# Patient Record
Sex: Male | Born: 1950
Health system: Southern US, Community
[De-identification: ages and names within clinical notes are randomized; demographics above are authoritative.]

## PROBLEM LIST (undated history)

## (undated) DIAGNOSIS — E7800 Pure hypercholesterolemia, unspecified: Secondary | ICD-10-CM

## (undated) DIAGNOSIS — Z86010 Personal history of colonic polyps: Secondary | ICD-10-CM

## (undated) DIAGNOSIS — T7840XA Allergy, unspecified, initial encounter: Secondary | ICD-10-CM

## (undated) DIAGNOSIS — M26629 Arthralgia of temporomandibular joint, unspecified side: Secondary | ICD-10-CM

## (undated) DIAGNOSIS — N209 Urinary calculus, unspecified: Secondary | ICD-10-CM

## (undated) HISTORY — DX: Personal history of colonic polyps: Z86.010

## (undated) HISTORY — DX: Pure hypercholesterolemia, unspecified: E78.00

## (undated) HISTORY — DX: Arthralgia of temporomandibular joint, unspecified side: M26.629

## (undated) HISTORY — PX: APPENDECTOMY: SHX54

## (undated) HISTORY — DX: Urinary calculus, unspecified: N20.9

## (undated) HISTORY — PX: VASECTOMY: SHX75

## (undated) HISTORY — DX: Allergy, unspecified, initial encounter: T78.40XA

---

## 2005-09-15 HISTORY — PX: COLONOSCOPY: SHX174

## 2010-11-28 ENCOUNTER — Ambulatory Visit: Payer: Self-pay | Admitting: Internal Medicine

## 2010-12-02 ENCOUNTER — Encounter: Payer: Self-pay | Admitting: Internal Medicine

## 2010-12-02 ENCOUNTER — Ambulatory Visit (INDEPENDENT_AMBULATORY_CARE_PROVIDER_SITE_OTHER): Payer: Managed Care, Other (non HMO) | Admitting: Internal Medicine

## 2010-12-02 ENCOUNTER — Encounter (INDEPENDENT_AMBULATORY_CARE_PROVIDER_SITE_OTHER): Payer: Self-pay | Admitting: *Deleted

## 2010-12-02 DIAGNOSIS — L570 Actinic keratosis: Secondary | ICD-10-CM | POA: Insufficient documentation

## 2010-12-02 DIAGNOSIS — E785 Hyperlipidemia, unspecified: Secondary | ICD-10-CM | POA: Insufficient documentation

## 2010-12-02 DIAGNOSIS — F329 Major depressive disorder, single episode, unspecified: Secondary | ICD-10-CM | POA: Insufficient documentation

## 2010-12-02 DIAGNOSIS — Z0000 Encounter for general adult medical examination without abnormal findings: Secondary | ICD-10-CM

## 2010-12-02 DIAGNOSIS — F3289 Other specified depressive episodes: Secondary | ICD-10-CM | POA: Insufficient documentation

## 2010-12-02 LAB — CONVERTED CEMR LAB
BUN: 21 mg/dL (ref 6–23)
Bilirubin, Direct: 0.1 mg/dL (ref 0.0–0.3)
CRP, High Sensitivity: 0.6
Chloride: 105 meq/L (ref 96–112)
Glucose, Bld: 96 mg/dL (ref 70–99)
Indirect Bilirubin: 0.5 mg/dL (ref 0.0–0.9)
LDL Cholesterol: 101 mg/dL — ABNORMAL HIGH (ref 0–99)
PSA: 0.52 ng/mL (ref <=4.00)
Potassium: 4.4 meq/L (ref 3.5–5.3)
Total Bilirubin: 0.6 mg/dL (ref 0.3–1.2)
VLDL: 21 mg/dL (ref 0–40)

## 2010-12-03 ENCOUNTER — Encounter: Payer: Self-pay | Admitting: Internal Medicine

## 2010-12-12 NOTE — Letter (Signed)
   [place] at [facility] [address] [place], [place]  [address]  [place] Phone: [phone]      December 03, 2010   [place] [address] [place], [place] [address]  RE:  LAB RESULTS  Dear  Mr. Kirshenbaum,  The following is an interpretation of your most recent lab tests.  Please take note of any instructions provided or changes to medications that have resulted from your lab work.  PSA:  normal - no follow-up needed PSA: 0.52  ELECTROLYTES:  Good - no changes needed  KIDNEY FUNCTION TESTS:  Good - no changes needed  LIVER FUNCTION TESTS:  Good - no changes needed  LIPID PANEL:  Good - no changes needed Triglyceride: 104   Cholesterol: 162   LDL: 101   HDL: 40   Chol/HDL%:  4.1 Ratio  THYROID STUDIES:  Thyroid studies normal TSH: 3.373     C Reactive Protein - normal.       Sincerely Yours,    Dr. Thomos Lemons  Appended Document:  mailed

## 2010-12-12 NOTE — Letter (Signed)
Summary: Primary Care Consult Scheduled Letter  [place] at [facility]  [address]   [place], [place] [address]   Phone: [phone]  Fax: [phone]      12/02/2010 MRN: [id number]  [place] STANDRE [address] [place], [place]  [address]  [place]    Dear Mr. Gagner,      We have scheduled an appointment for you.  At the recommendation of Dr.YOO, we have scheduled you a consult with [facility] , GARY ENGSTROM,PA on APRIL 24 ,2012 at 9AM.  Their address is_[address] ,[place]  [address]. The office phone number is [phone].  If this appointment day and time is not convenient for you, please feel free to call the office of the doctor you are being referred to at the number listed above and reschedule the appointment.     It is important for you to keep your scheduled appointments. We are here to make sure you are given good patient care.    Thank you,  Darral Dash Patient Care Coordinator [place] at [facility]

## 2010-12-17 NOTE — Assessment & Plan Note (Signed)
Summary: new moved to this slot as Dr Artist Pais had meeting/mhf   Vital Signs:  Patient profile:   60 year old male Height:      66.25 inches Weight:      171.25 pounds BMI:     27.53 O2 Sat:      100 % on Room air Temp:     97.5 degrees F oral Pulse rate:   46 / minute Resp:     16 per minute BP sitting:   148 / 80  (left arm) Cuff size:   regular  Vitals Entered By: Glendell Docker CMA (December 02, 2010 8:42 AM)  O2 Flow:  Room air CC: New Patient  Comments discuss change in hearing over the past 6 months, evaluation of spot on forehead   Primary Care Provider:  Dondra Spry DO  CC:  New Patient .  History of Present Illness: 60 y/o male to establish  he has hx of hyperlipidemia.  not taking meds reduced fatty food intake increase in exericse - mainly walking.  10 miles per day  hx of adj d/o - resolved  Preventive Screening-Counseling & Management  Alcohol-Tobacco     Alcohol drinks/day: 0     Smoking Status: never  Caffeine-Diet-Exercise     Caffeine use/day: 2 beverages daily     Does Patient Exercise: yes     Times/week: 5  Allergies (verified): No Known Drug Allergies  Past History:  Past Medical History: Adjustment D/O depression - related to stress at work   stopped taking celexa x 1 yr Hyperlipidemia - never took simvastatin colon polyp -   Past Surgical History: Appendectomy 1971     Family History: Family History of Arthritis-mother Family History of Colon CA 1st degree relative father Family History High cholesterol father Alzhiemers-mother mother died age 60 - complications of alzheimer father 52 - fairly healthy no prostate cancer    Social History: Occupation: Magazine features editor Stage manager) Married 36 years Corrie Dandy) 1 daughter 3 sons    Never Smoked Alcohol use-no moved from [employer] Status:  never Caffeine use/day:  2 beverages daily Does Patient Exercise:  yes  Physical Exam  General:  alert, well-developed, and  well-nourished.   Head:  normocephalic and atraumatic.   Ears:  unable to hear finger rub in right ear Mouth:  good dentition and pharynx pink and moist.   Neck:  No deformities, masses, or tenderness noted.no carotid bruits.   Lungs:  normal respiratory effort, no accessory muscle use, no crackles, and no wheezes.   Heart:  regular rhythm, no murmur, no gallop, no rub, and bradycardia.   Abdomen:  soft, non-tender, normal bowel sounds, and no masses.   Pulses:  dorsalis pedis and posterior tibial pulses are full and equal bilaterally Extremities:  No lower extremity edema Neurologic:  cranial nerves II-XII intact and gait normal.     Impression & Recommendations:  Problem # 1:  PREVENTIVE HEALTH CARE (ICD-V70.0)  pt declines DRE  Orders: EKG w/ Interpretation (93000)  Colonoscopy: Done (12/05/2008) Flu Vax: Historical (07/29/2010)     Problem # 2:  HYPERLIPIDEMIA (ICD-272.4)  Orders: T-Basic Metabolic Panel [id number]) T-Lipid Profile [id number]) T-Hepatic Function [id number]) CRP, high sensitivity-[facility] ([id number]) T-TSH [id number])  Problem # 3:  ACTINIC KERATOSIS, HEAD (ICD-702.0)  Orders: Dermatology Referral (Derma)  Other Orders: T-PSA ([id number])  Patient Instructions: 1)  Our office will contact you re:  dermatology referral 2)  Please forward copy of your colonoscopy results 3)  Please schedule a follow-up appointment in  1 year. 4)  We will contact you re:  blood test results 5)  Please schedule a follow-up appointment in 1 year. 6)  Please schedule a follow-up appointment as needed.   Orders Added: 1)  T-Basic Metabolic Panel [[id number]] 2)  T-Lipid Profile [[id number]] 3)  T-Hepatic Function [[id number]] 4)  CRP, high sensitivity-[facility] [[id number]] 5)  T-TSH [[id number]] 6)  T-PSA [[id number]] 7)  EKG w/ Interpretation [93000] 8)  Dermatology Referral [Derma] 9)  New Patient 40-64 years [99386]   Immunization  History:  Influenza Immunization History:    Influenza:  historical (07/29/2010)   Immunization History:  Influenza Immunization History:    Influenza:  Historical (07/29/2010)  Current Allergies (reviewed today): No known allergies     Preventive Care Screening  Colonoscopy:    Date:  12/05/2008    Results:  Done

## 2013-05-24 ENCOUNTER — Encounter: Payer: Self-pay | Admitting: Internal Medicine

## 2013-05-24 ENCOUNTER — Ambulatory Visit (INDEPENDENT_AMBULATORY_CARE_PROVIDER_SITE_OTHER): Payer: Managed Care, Other (non HMO) | Admitting: Internal Medicine

## 2013-05-24 VITALS — BP 145/92 | HR 74 | Temp 97.6°F | Ht 66.0 in | Wt 177.8 lb

## 2013-05-24 DIAGNOSIS — Z0000 Encounter for general adult medical examination without abnormal findings: Secondary | ICD-10-CM | POA: Insufficient documentation

## 2013-05-24 NOTE — Progress Notes (Signed)
  Subjective:    Patient ID: Justin Murphy, male    DOB: Jul 16, 1951, 62 y.o.   MRN: [id number]  HPI CPX  Past Medical History  Diagnosis Date  . Allergy    Past Surgical History  Procedure Laterality Date  . Appendectomy    . Vasectomy     History   Social History  . Marital Status: Married    Spouse Name: N/A    Number of Children: 4  . Years of Education: N/A   Occupational History  . programer     Social History Main Topics  . Smoking status: Never Smoker   . Smokeless tobacco: Never Used  . Alcohol Use: Yes     Comment: very rare   . Drug Use: No  . Sexual Activity: Not on file   Other Topics Concern  . Not on file   Social History Narrative   Original from [place] moved to [place] after retirement ~ 2011   Married 38 years Corrie Dandy)   1 daughter, 3 sons          Family History  Problem Relation Age of Onset  . Alzheimer's disease Mother   . Colon cancer Father     dx early 31s  . Prostate cancer Neg Hx   . Diabetes Other     uncle  . Heart disease Neg Hx      Review of Systems  Denies chest pain or shortness or breath No nausea, vomiting, diarrhea. A year ago, was visiting [place], so significant amount of red blood per rectum, was admitted to the hospital, no colonoscopy was done, was recommended to follow up with GI but he didn't. No further symptoms since. Reports his stools are occasionally loose but not diarrhea. No dysuria, difficulty urinating or gross hematuria.      Objective:   Physical Exam  BP 145/92  Pulse 74  Temp(Src) 97.6 F (36.4 C) (Oral)  Ht 5\' 6"  (1.676 m)  Wt 177 lb 12.8 oz (80.65 kg)  BMI 28.71 kg/m2  SpO2 99% General -- alert, well-developed, NAD.  Neck --no thyromegaly , normal carotid pulse Lungs -- normal respiratory effort, no intercostal retractions, no accessory muscle use, and normal breath sounds.  Heart-- normal rate, regular rhythm, no murmur.  Abdomen-- Not distended, good bowel sounds,soft,  non-tender. No mass,organomegaly.No rebound or rigidity. Rectal-- No external abnormalities noted. Normal sphincter tone. No rectal masses or tenderness. Brown stool, Hemoccult negative  Prostate: Prostate gland firm and smooth, no enlargement, nodularity, tenderness, mass, asymmetry or induration. Extremities-- no pretibial edema bilaterally  Neurologic-- alert & oriented X3. Speech, gait normal. Strength normal in all extremities.  Psych-- Cognition and judgment appear intact. Alert and cooperative with normal attention span and concentration. not anxious appearing and not depressed appearing.      Assessment & Plan:

## 2013-05-24 NOTE — Patient Instructions (Signed)
Please come back fasting: FLP, CMP, TSH, CBC, PSA--- dx V70 --- Think about a Tdap and zostavax ---- Check the  blood pressure 2 or 3 times a week, be sure it is between 110/60 and 140/85. If it is consistently higher or lower, let me know --- Next visit in one year and as needed

## 2013-05-25 ENCOUNTER — Encounter: Payer: Self-pay | Admitting: Internal Medicine

## 2013-05-25 ENCOUNTER — Ambulatory Visit: Payer: Managed Care, Other (non HMO) | Admitting: Internal Medicine

## 2013-05-25 NOTE — Assessment & Plan Note (Signed)
Last CPX dr Artist Pais 2012, see centricity, had a EKG  Tdap? Declined Zostavax? Benefits discussed, declined + FH Colon Ca, h/o GI bleed a year ago, see HPI. Had a cscope at age 62, no reports: strongly encouraged to get a Cscope, will refer to GI DRE normal, check a PSA Labs  Diet- Reports he is trying to eat healthy Exercise, doing great, walks up to 7 miles almost every day. Plays golf   Other issues: BP slt elevated, usually normal amb BPs, rec self monitoring Needs a referral to an audiologist, will arrange

## 2013-05-27 ENCOUNTER — Other Ambulatory Visit (INDEPENDENT_AMBULATORY_CARE_PROVIDER_SITE_OTHER): Payer: Managed Care, Other (non HMO)

## 2013-05-27 DIAGNOSIS — Z0000 Encounter for general adult medical examination without abnormal findings: Secondary | ICD-10-CM

## 2013-05-27 LAB — COMPREHENSIVE METABOLIC PANEL
CO2: 26 mEq/L (ref 19–32)
Calcium: 9 mg/dL (ref 8.4–10.5)
Creatinine, Ser: 0.9 mg/dL (ref 0.4–1.5)
GFR: 86.78 mL/min (ref >60.00)
Glucose, Bld: 94 mg/dL (ref 70–99)
Total Bilirubin: 0.7 mg/dL (ref 0.3–1.2)
Total Protein: 6.2 g/dL (ref 6.0–8.3)

## 2013-05-27 LAB — CBC WITH DIFFERENTIAL/PLATELET
Basophils Absolute: 0 10*3/uL (ref 0.0–0.1)
Hemoglobin: 15 g/dL (ref 13.0–17.0)
Lymphocytes Relative: 32.3 % (ref 12.0–46.0)
Monocytes Relative: 8.9 % (ref 3.0–12.0)
Neutro Abs: 2.6 10*3/uL (ref 1.4–7.7)
Neutrophils Relative %: 55.6 % (ref 43.0–77.0)
RBC: 4.52 Mil/uL (ref 4.22–5.81)
RDW: 13 % (ref 11.5–14.6)

## 2013-05-27 LAB — LIPID PANEL
HDL: 35.7 mg/dL — ABNORMAL LOW (ref >39.00)
Triglycerides: 111 mg/dL (ref 0.0–149.0)

## 2013-05-27 LAB — TSH: TSH: 2.09 u[IU]/mL (ref 0.35–5.50)

## 2013-05-27 LAB — PSA: PSA: 0.63 ng/mL (ref 0.10–4.00)

## 2013-05-30 ENCOUNTER — Telehealth: Payer: Self-pay | Admitting: *Deleted

## 2013-05-30 ENCOUNTER — Encounter: Payer: Self-pay | Admitting: *Deleted

## 2013-05-30 NOTE — Telephone Encounter (Signed)
Message copied by Eustace Quail on Mon May 30, 2013  8:29 AM ------      Message from: Willow Ora E      Created: Sun May 29, 2013  1:13 PM       Send a letter      Lollie Sails,      Your cholesterol is very good at 184.      Your liver, kidney, potassium, thyroid and prostate tests are all normal.      A CBC showed no anemia, your platelets are a little low  at around 122. We do need to recheck a CBC every year.      These are good results. ------

## 2013-05-30 NOTE — Telephone Encounter (Signed)
Letter sent. DJR  

## 2013-06-24 ENCOUNTER — Encounter: Payer: Self-pay | Admitting: Internal Medicine

## 2013-10-05 ENCOUNTER — Encounter: Payer: Self-pay | Admitting: Internal Medicine

## 2013-10-05 ENCOUNTER — Ambulatory Visit (INDEPENDENT_AMBULATORY_CARE_PROVIDER_SITE_OTHER): Payer: Managed Care, Other (non HMO) | Admitting: Internal Medicine

## 2013-10-05 VITALS — BP 124/85 | HR 84 | Temp 97.9°F | Wt 177.0 lb

## 2013-10-05 DIAGNOSIS — R319 Hematuria, unspecified: Secondary | ICD-10-CM

## 2013-10-05 DIAGNOSIS — R310 Gross hematuria: Secondary | ICD-10-CM | POA: Diagnosis not present

## 2013-10-05 LAB — POCT URINALYSIS DIPSTICK
Glucose, UA: NEGATIVE
Ketones, UA: NEGATIVE
LEUKOCYTES UA: NEGATIVE
NITRITE UA: NEGATIVE
PH UA: 6
Spec Grav, UA: >=1.03
UROBILINOGEN UA: 0.2

## 2013-10-05 LAB — URINALYSIS, ROUTINE W REFLEX MICROSCOPIC
Bilirubin Urine: NEGATIVE
KETONES UR: NEGATIVE
Leukocytes, UA: NEGATIVE
Nitrite: NEGATIVE
TOTAL PROTEIN, URINE-UPE24: NEGATIVE
URINE GLUCOSE: 100 — AB
Urobilinogen, UA: 0.2 (ref 0.0–1.0)
pH: 5.5 (ref 5.0–8.0)

## 2013-10-05 LAB — COMPREHENSIVE METABOLIC PANEL
ALBUMIN: 4.2 g/dL (ref 3.5–5.2)
ALT: 25 U/L (ref 0–53)
AST: 21 U/L (ref 0–37)
Alkaline Phosphatase: 59 U/L (ref 39–117)
BUN: 26 mg/dL — ABNORMAL HIGH (ref 6–23)
CALCIUM: 9.6 mg/dL (ref 8.4–10.5)
CHLORIDE: 109 meq/L (ref 96–112)
CO2: 26 meq/L (ref 19–32)
Creatinine, Ser: 1 mg/dL (ref 0.4–1.5)
GFR: 77.13 mL/min (ref >60.00)
Glucose, Bld: 108 mg/dL — ABNORMAL HIGH (ref 70–99)
Potassium: 3.8 mEq/L (ref 3.5–5.1)
SODIUM: 141 meq/L (ref 135–145)
TOTAL PROTEIN: 7 g/dL (ref 6.0–8.3)
Total Bilirubin: 0.7 mg/dL (ref 0.3–1.2)

## 2013-10-05 MED ORDER — CIPROFLOXACIN HCL 500 MG PO TABS: 500.0000 mg | ORAL_TABLET | Freq: Two times a day (BID) | ORAL | Status: DC

## 2013-10-05 NOTE — Progress Notes (Signed)
Pre visit review using our clinic review tool, if applicable. No additional management support is needed unless otherwise documented below in the visit note. 

## 2013-10-05 NOTE — Progress Notes (Signed)
   Subjective:    Patient ID: Justin Murphy, male    DOB: [birth year], 63 y.o.   MRN: [id number]  HPI Acute visit Symptoms started 6 days ago with on and off brownish colored urine with a strong odor, 3 days ago he thinks he saw traces of blood and yesterday x 2 times did see gross  hematuria. Other than that, he feels well except for mild suprapubic discomfort. No history of previous gross hematuria.  Past Medical History  Diagnosis Date  . Allergy   . Urolithiasis     in the 80s   Past Surgical History  Procedure Laterality Date  . Appendectomy    . Vasectomy      Review of Systems No fever or chills. No nausea vomiting. No flank pain. No dysuria or difficulty urinating. No other evidence of other bleeding such as blood in the  gum or stool. Denies any genital pain, testicular swelling         Objective:   Physical Exam BP 124/85  Pulse 84  Temp(Src) 97.9 F (36.6 C)  Wt 177 lb (80.287 kg)  SpO2 97% General -- alert, well-developed, NAD.   HEENT-- Not pale or jaundice  Lungs -- normal respiratory effort, no intercostal retractions, no accessory muscle use, and normal breath sounds.  Heart-- normal rate, regular rhythm, no murmur.  Abdomen-- Not distended, good bowel sounds,soft, non-tender. No CVA tenderness  Extremities-- no pretibial edema bilaterally  Neurologic--  alert & oriented X3. Speech normal, gait normal, strength normal in all extremities.   Psych-- Cognition and judgment appear intact. Cooperative with normal attention span and concentration. No anxious or depressed appearing.         Assessment & Plan:

## 2013-10-05 NOTE — Assessment & Plan Note (Addendum)
63 year old gentleman, nonsmoker with remote history of kidney stones presents with painless gross hematuria. DRE PSA 9-14 wnl Udip: ++ blood, (-) nitrates  Plan: CMP, UA, urine culture, renal CT, empiric cipro If he does not have a urinary tract infection, he will be refer  to  urology for further eval

## 2013-10-05 NOTE — Patient Instructions (Signed)
Get your blood work before you leave   Talk to [place] before you leave today about  The CT. Come back in 2 weeks for a checkup Call anytime if fever, chills, increased symptoms.

## 2013-10-06 ENCOUNTER — Ambulatory Visit (HOSPITAL_BASED_OUTPATIENT_CLINIC_OR_DEPARTMENT_OTHER)
Admission: RE | Admit: 2013-10-06 | Discharge: 2013-10-06 | Disposition: A | Discharge status: Home / Self Care | Payer: Managed Care, Other (non HMO) | Source: Ambulatory Visit | Attending: Internal Medicine | Admitting: Internal Medicine

## 2013-10-06 ENCOUNTER — Telehealth: Payer: Self-pay | Admitting: Internal Medicine

## 2013-10-06 DIAGNOSIS — N201 Calculus of ureter: Secondary | ICD-10-CM | POA: Insufficient documentation

## 2013-10-06 DIAGNOSIS — Z87442 Personal history of urinary calculi: Secondary | ICD-10-CM | POA: Insufficient documentation

## 2013-10-06 DIAGNOSIS — N209 Urinary calculus, unspecified: Secondary | ICD-10-CM

## 2013-10-06 DIAGNOSIS — R310 Gross hematuria: Secondary | ICD-10-CM

## 2013-10-06 DIAGNOSIS — R319 Hematuria, unspecified: Secondary | ICD-10-CM | POA: Insufficient documentation

## 2013-10-06 DIAGNOSIS — R109 Unspecified abdominal pain: Secondary | ICD-10-CM | POA: Insufficient documentation

## 2013-10-06 MED ORDER — TAMSULOSIN HCL 0.4 MG PO CAPS: 0.4000 mg | ORAL_CAPSULE | Freq: Every day | ORAL | Status: DC

## 2013-10-06 NOTE — Telephone Encounter (Signed)
CT of the abdomen show with the distal left kidney stone, urine culture pending. Patient feels about the same, did see blood in the urine yesterday, none today. Plan: Start Flomax Urology referral Strain all his urine, a strainer will be left at the front desk Call if he feels worse. Patient in agreement

## 2013-10-07 LAB — URINE CULTURE
Colony Count: NO GROWTH
Organism ID, Bacteria: NO GROWTH

## 2013-10-28 ENCOUNTER — Other Ambulatory Visit: Payer: Self-pay | Admitting: Urology

## 2013-11-01 ENCOUNTER — Encounter (HOSPITAL_COMMUNITY): Payer: Self-pay | Admitting: Pharmacy Technician

## 2013-11-04 ENCOUNTER — Encounter (HOSPITAL_COMMUNITY): Payer: Self-pay | Admitting: *Deleted

## 2013-11-10 ENCOUNTER — Ambulatory Visit (HOSPITAL_COMMUNITY)
Admission: RE | Admit: 2013-11-10 | Discharge: 2013-11-10 | Disposition: A | Discharge status: Home / Self Care | Payer: Managed Care, Other (non HMO) | Source: Ambulatory Visit | Attending: Urology | Admitting: Urology

## 2013-11-10 ENCOUNTER — Encounter (HOSPITAL_COMMUNITY): Payer: Self-pay | Admitting: Emergency Medicine

## 2013-11-10 ENCOUNTER — Encounter (HOSPITAL_COMMUNITY): Payer: Self-pay | Admitting: *Deleted

## 2013-11-10 ENCOUNTER — Ambulatory Visit (HOSPITAL_COMMUNITY): Payer: Managed Care, Other (non HMO)

## 2013-11-10 ENCOUNTER — Encounter (HOSPITAL_COMMUNITY)
Admission: RE | Disposition: A | Discharge status: Home / Self Care | Payer: Self-pay | Source: Ambulatory Visit | Attending: Urology

## 2013-11-10 ENCOUNTER — Emergency Department (HOSPITAL_COMMUNITY)
Admission: EM | Admit: 2013-11-10 | Discharge: 2013-11-11 | Disposition: A | Discharge status: Home / Self Care | Payer: Managed Care, Other (non HMO) | Attending: Emergency Medicine | Admitting: Emergency Medicine

## 2013-11-10 DIAGNOSIS — R339 Retention of urine, unspecified: Secondary | ICD-10-CM | POA: Diagnosis present

## 2013-11-10 DIAGNOSIS — N23 Unspecified renal colic: Secondary | ICD-10-CM | POA: Diagnosis not present

## 2013-11-10 DIAGNOSIS — Z79899 Other long term (current) drug therapy: Secondary | ICD-10-CM | POA: Insufficient documentation

## 2013-11-10 DIAGNOSIS — Z9089 Acquired absence of other organs: Secondary | ICD-10-CM | POA: Insufficient documentation

## 2013-11-10 DIAGNOSIS — Z87442 Personal history of urinary calculi: Secondary | ICD-10-CM | POA: Insufficient documentation

## 2013-11-10 DIAGNOSIS — N201 Calculus of ureter: Secondary | ICD-10-CM | POA: Insufficient documentation

## 2013-11-10 DIAGNOSIS — Z9852 Vasectomy status: Secondary | ICD-10-CM | POA: Insufficient documentation

## 2013-11-10 DIAGNOSIS — N200 Calculus of kidney: Secondary | ICD-10-CM | POA: Insufficient documentation

## 2013-11-10 DIAGNOSIS — Z9109 Other allergy status, other than to drugs and biological substances: Secondary | ICD-10-CM | POA: Insufficient documentation

## 2013-11-10 LAB — I-STAT CHEM 8, ED
BUN: 14 mg/dL (ref 6–23)
Calcium, Ion: 1.15 mmol/L (ref 1.13–1.30)
Chloride: 99 mEq/L (ref 96–112)
Creatinine, Ser: 1.1 mg/dL (ref 0.50–1.35)
Glucose, Bld: 132 mg/dL — ABNORMAL HIGH (ref 70–99)
HCT: 47 % (ref 39.0–52.0)
Hemoglobin: 16 g/dL (ref 13.0–17.0)
Potassium: 3.8 mEq/L (ref 3.7–5.3)
Sodium: 139 mEq/L (ref 137–147)
TCO2: 27 mmol/L (ref 0–100)

## 2013-11-10 SURGERY — LITHOTRIPSY, ESWL
Anesthesia: LOCAL | Laterality: Left

## 2013-11-10 MED ORDER — SODIUM CHLORIDE 0.9 % IV BOLUS (SEPSIS)
1000.0000 mL | Freq: Once | INTRAVENOUS | Status: AC
  Administered 2013-11-10: 1000 mL via INTRAVENOUS

## 2013-11-10 MED ORDER — HYDROMORPHONE HCL PF 1 MG/ML IJ SOLN
1.0000 mg | Freq: Once | INTRAMUSCULAR | Status: AC
  Administered 2013-11-10: 1 mg via INTRAVENOUS
  Filled 2013-11-10: qty 1

## 2013-11-10 MED ORDER — SENNOSIDES-DOCUSATE SODIUM 8.6-50 MG PO TABS
1.0000 | ORAL_TABLET | Freq: Two times a day (BID) | ORAL | Status: DC
Start: 2013-11-10 — End: 2014-01-24

## 2013-11-10 MED ORDER — KETOROLAC TROMETHAMINE 30 MG/ML IJ SOLN
30.0000 mg | Freq: Once | INTRAMUSCULAR | Status: AC
  Administered 2013-11-11: 30 mg via INTRAVENOUS

## 2013-11-10 MED ORDER — OXYCODONE-ACETAMINOPHEN 5-325 MG PO TABS: 1.0000 | ORAL_TABLET | ORAL | Status: DC | PRN

## 2013-11-10 MED ORDER — KETOROLAC TROMETHAMINE 15 MG/ML IJ SOLN
INTRAMUSCULAR | Status: AC
  Filled 2013-11-10: qty 2

## 2013-11-10 MED ORDER — GENTAMICIN SULFATE 40 MG/ML IJ SOLN
400.0000 mg | INTRAVENOUS | Status: AC
  Administered 2013-11-10: 400 mg via INTRAVENOUS
  Filled 2013-11-10: qty 10

## 2013-11-10 MED ORDER — SODIUM CHLORIDE 0.9 % IV SOLN
INTRAVENOUS | Status: DC
  Administered 2013-11-10: 08:00:00 via INTRAVENOUS

## 2013-11-10 MED ORDER — DIPHENHYDRAMINE HCL 25 MG PO CAPS
25.0000 mg | ORAL_CAPSULE | ORAL | Status: AC
  Administered 2013-11-10: 25 mg via ORAL
  Filled 2013-11-10: qty 1

## 2013-11-10 MED ORDER — DIAZEPAM 5 MG PO TABS
10.0000 mg | ORAL_TABLET | ORAL | Status: AC
  Administered 2013-11-10: 10 mg via ORAL
  Filled 2013-11-10: qty 2

## 2013-11-10 NOTE — ED Notes (Signed)
1 unsuccessful IV attempt.

## 2013-11-10 NOTE — ED Notes (Signed)
Pt states had a lithotripsy today, states hasn't voided in 3 hours and has been drinking a lot of fluids per ordered, pt states in a lot of pain.

## 2013-11-10 NOTE — H&P (Signed)
Justin Murphy is an 63 y.o. male.    Chief Complaint: Pre-OP Left Shockwave Lithotripsy  HPI:   1 - Gross Hematuria - Pt with new gross hematuria 09/2013 x several. Non smoker. No dye / chemical / textile exposure. CT stone 09/2013 with left sided UPJ stone. No cysto / contrast upper tract eval thus far.  2 - Nephrolithiasis -  Pre 2015 - URS x 1 09/2013 - Left 12m UPJ stone, no hydro, 9cm SSD, 450 HU by CT stone. No additional stones.   3 - Prostate Cancer Screening - No  FHX prostate cancer 09/2013 - PSA 0.63 (PCP draw), DRE 30gm smooth  PMH sig for appy, vas. No CV disease. No strong blood thinners. 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 No interval fevers.  Past Medical History  Diagnosis Date  . Allergy   . Urolithiasis     in the 80s    Past Surgical History  Procedure Laterality Date  . Appendectomy    . Vasectomy      Family History  Problem Relation Age of Onset  . Alzheimer's disease Mother   . Colon cancer Father     dx early 434s . Prostate cancer Neg Hx   . Diabetes Other     uncle  . Heart disease Neg Hx    Social History:  reports that he has never smoked. He has never used smokeless tobacco. He reports that he drinks alcohol. He reports that he does not use illicit drugs.  Allergies: No Known Allergies  Medications Prior to Admission  Medication Sig Dispense Refill  . acetaminophen (TYLENOL) 500 MG tablet Take 1,000 mg by mouth every 6 (six) hours as needed for mild pain or moderate pain.      . fexofenadine (ALLEGRA) 180 MG tablet Take 180 mg by mouth daily.      . Multiple Vitamin (MULTIVITAMIN WITH MINERALS) TABS tablet Take 1 tablet by mouth daily.        No results found for this or any previous visit (from the past 48 hour(s)). No results found.  Review of Systems  Constitutional: Negative.  Negative for fever and chills.   HENT: Negative.   Eyes: Negative.   Respiratory: Negative.   Cardiovascular: Negative.   Gastrointestinal: Negative.  Negative for vomiting.  Genitourinary: Positive for hematuria.  Musculoskeletal: Negative.   Skin: Negative.   Neurological: Negative.   Endo/Heme/Allergies: Negative.   Psychiatric/Behavioral: Negative.     Blood pressure 127/88, pulse 65, temperature 97.5 F (36.4 C), temperature source Oral, resp. rate 18, height 5' 6"  (1.676 m), weight 80.854 kg (178 lb 4 oz), SpO2 97.00%. Physical Exam  Constitutional: He is oriented to person, place, and time. He appears well-developed and well-nourished.  HENT:  Head: Normocephalic and atraumatic.  Eyes: EOM are normal. Pupils are equal, round, and reactive to light.  Neck: Normal range of motion. Neck supple.  Cardiovascular: Normal rate and regular rhythm.   Respiratory: Effort normal.  GI: Soft. Bowel sounds are normal.  Genitourinary: Penis normal.  NO sig CVAT  Musculoskeletal: Normal range of motion.  Neurological: He is alert and oriented to person, place, and time.  Skin: Skin is warm and dry.  Psychiatric: He has a normal mood and affect. His behavior is normal. Judgment and thought content normal.     Assessment/Plan  1 - Gross Hematuria -  likely from left stone. Will still need cysto and urograms retrogrades at some point to r/o other causes if does not resolve with stone treatment.   2 - Nephrolithiasis - Left UPJ stone, likely symptomatic from intermit hematuria.  Very low chance of spontaneous passage at this seize.   We rediscussed management strategies of ureteral stones including medical expulsive therapy (MET) (preferred for stones <38m diameter), ureteroscopic stone manipulation (URS), and shockwave lithotripsy (SWL) in detail including relative risks / benefits / and efficacy. We discussed that all patients are candidates for MET as long as can keep comfortable and hydrated.   After consideration  of options, the patient has decided to proceed with left SWL Justin.  We discussed shockwave lithotripsy in detail as well as my "rule of 9s" with stones <971m less than 900 HU, and skin to stone distance <9cm having approximately 90% treatment success with single session of treatment. We then addressed how stones that are larger, more dense, and in patients with less favorable anatomy have incrementally decreased success rates. We discussed risks including, bleeding, infection, hematoma, loss of kidney, need for staged therapy, need for adjunctive therapy and requirement to refrain from any anticoagulants, anti-platelet or aspirin-like products peri-procedureally. After careful consideration, the patient has chosen to proceed.   3 - Prostate Cancer Screening -  Up to date this year. Justin Murphy  11/10/2013, 6:45 AM

## 2013-11-10 NOTE — ED Notes (Signed)
Pt aware of need for urine specimen at this time.

## 2013-11-10 NOTE — Discharge Instructions (Signed)
1 - You may have urinary urgency (bladder spasms), left flank pain and bloody urine on / off next few days. This is normal.  2 - Call MD or go to ER for fever >102, severe pain / nausea / vomiting not relieved by medications, or acute change in medical status

## 2013-11-11 DIAGNOSIS — N23 Unspecified renal colic: Secondary | ICD-10-CM | POA: Diagnosis not present

## 2013-11-11 LAB — URINALYSIS, ROUTINE W REFLEX MICROSCOPIC
Bilirubin Urine: NEGATIVE
Glucose, UA: NEGATIVE mg/dL
Ketones, ur: NEGATIVE mg/dL
Leukocytes, UA: NEGATIVE
Nitrite: NEGATIVE
Protein, ur: NEGATIVE mg/dL
Specific Gravity, Urine: 1.014 (ref 1.005–1.030)
Urobilinogen, UA: 0.2 mg/dL (ref 0.0–1.0)
pH: 7.5 (ref 5.0–8.0)

## 2013-11-11 LAB — URINE MICROSCOPIC-ADD ON

## 2013-11-11 NOTE — ED Notes (Signed)
Toradol wouldn't cross over in pyxis so I pulled it out by overriding it.

## 2013-11-11 NOTE — ED Provider Notes (Signed)
CSN: [id number]     Arrival date & time 11/10/13  2111 History   First MD Initiated Contact with Patient 11/10/13 2121     Chief Complaint  Patient presents with  . Urinary Retention     (Consider location/radiation/quality/duration/timing/severity/associated sxs/prior Treatment) HPI Patient presents to the emergency department with suprapubic pain, following a lithotripsy from earlier today.  The patient, states, that he, increasing lower pubic pain, got worse this evening.  Patient, states, that he passed several kidney stone fragments.  The patient, states, that he had an a large amount of urination 3 hours prior to arrival.  Patient, states, that he feels a sharp pain in his bladder region.  Patient denies nausea, vomiting, diarrhea, headache, blurred vision, weakness, numbness, dizziness, chest pain, shortness of breath, back pain, or syncope.  She states nothing seems to make his condition, better or worse. Past Medical History  Diagnosis Date  . Allergy   . Urolithiasis     in the 80s   Past Surgical History  Procedure Laterality Date  . Appendectomy    . Vasectomy     Family History  Problem Relation Age of Onset  . Alzheimer's disease Mother   . Colon cancer Father     dx early 59s  . Prostate cancer Neg Hx   . Diabetes Other     uncle  . Heart disease Neg Hx    History  Substance Use Topics  . Smoking status: Never Smoker   . Smokeless tobacco: Never Used  . Alcohol Use: Yes     Comment: very rare     Review of Systems  All other systems negative except as documented in the HPI. All pertinent positives and negatives as reviewed in the HPI.  Allergies  Review of patient's allergies indicates no known allergies.  Home Medications   Current Outpatient Rx  Name  Route  Sig  Dispense  Refill  . fexofenadine (ALLEGRA) 180 MG tablet   Oral   Take 180 mg by mouth daily.         . Multiple Vitamin (MULTIVITAMIN WITH MINERALS) TABS tablet   Oral   Take 1  tablet by mouth daily.         Marland Kitchen oxyCODONE-acetaminophen (ROXICET) 5-325 MG per tablet   Oral   Take 1-2 tablets by mouth every 4 (four) hours as needed for moderate pain or severe pain. Post-operatively   30 tablet   0   . senna-docusate (SENOKOT-S) 8.6-50 MG per tablet   Oral   Take 1 tablet by mouth 2 (two) times daily. While taking pain meds to prevent constipation   30 tablet   0   . tamsulosin (FLOMAX) 0.4 MG CAPS capsule   Oral   Take 0.4 mg by mouth daily.          BP 164/104  Pulse 49  Temp(Src) 97.5 F (36.4 C) (Oral)  Resp 20  SpO2 96% Physical Exam  Nursing note and vitals reviewed. Constitutional: He is oriented to person, place, and time. He appears well-developed and well-nourished. No distress.  HENT:  Head: Normocephalic and atraumatic.  Mouth/Throat: Oropharynx is clear and moist.  Eyes: Pupils are equal, round, and reactive to light.  Cardiovascular: Normal rate, regular rhythm and normal heart sounds.  Exam reveals no gallop and no friction rub.   No murmur heard. Pulmonary/Chest: Effort normal and breath sounds normal. No respiratory distress.  Abdominal: Soft. Bowel sounds are normal. He exhibits no distension. There is no  tenderness. There is no guarding.  Neurological: He is alert and oriented to person, place, and time.  Skin: Skin is warm and dry. No rash noted. No erythema.    ED Course  Procedures (including critical care time) Labs Review Labs Reviewed  URINALYSIS, ROUTINE W REFLEX MICROSCOPIC - Abnormal; Notable for the following:    APPearance CLOUDY (*)    Hgb urine dipstick LARGE (*)    All other components within normal limits  I-STAT CHEM 8, ED - Abnormal; Notable for the following:    Glucose, Bld 132 (*)    All other components within normal limits  URINE MICROSCOPIC-ADD ON   Imaging Review Dg Abd 1 View  11/10/2013   CLINICAL DATA:  Pre lithotripsy  EXAM: ABDOMEN - 1 VIEW  COMPARISON:  Prior CT from 10/06/2013.   FINDINGS: Visualized bowel gas pattern is nonobstructive. 9 mm calcific density overlies the left renal hilum, compatible with previously seen stone in this region. No other renal calculi identified. No soft tissue mass.  No acute osseous abnormality.  IMPRESSION: 9 mm calcification overlying the region of the left ureteropelvic junction, consistent with previously identified stone in this region. No other renal calculi identified.   Electronically Signed   By: Jeannine Boga M.D.   On: 11/10/2013 06:51    I spoke with the, on call urologist, and he felt that pain control is important, along with fluids.  He advised that there is a significant amount of bladder irritation.  Following this type of procedure and he felt this is what was causing the patient's pain.  Patient has no renal impairment noted on his chemistry panel.  He has blood on his urine but no other findings.  The patient's pain is under a fair control.  He'll be discharged home and advised to call Dr. Tresa Moore tomorrow for followup.    Brent General, PA-C 11/11/13 0127

## 2013-11-11 NOTE — Discharge Instructions (Signed)
Return here as needed.  Followup with your urologist by calling them tomorrow

## 2013-11-14 NOTE — ED Provider Notes (Signed)
Medical screening examination/treatment/procedure(s) were performed by non-physician practitioner and as supervising physician I was immediately available for consultation/collaboration.   EKG Interpretation None       Virgel Manifold, MD 11/14/13 1433

## 2014-01-24 ENCOUNTER — Encounter: Payer: Self-pay | Admitting: Internal Medicine

## 2014-01-24 ENCOUNTER — Ambulatory Visit (INDEPENDENT_AMBULATORY_CARE_PROVIDER_SITE_OTHER): Payer: Managed Care, Other (non HMO) | Admitting: Internal Medicine

## 2014-01-24 VITALS — BP 131/77 | HR 84 | Temp 98.4°F | Wt 176.4 lb

## 2014-01-24 DIAGNOSIS — H109 Unspecified conjunctivitis: Secondary | ICD-10-CM

## 2014-01-24 MED ORDER — GENTAMICIN FORTIFIED OPHTHALMIC SOLUTION: OPHTHALMIC | Status: DC

## 2014-01-24 NOTE — Progress Notes (Signed)
   Subjective:    Patient ID: Justin Murphy, male    DOB: 12-Oct-1950, 63 y.o.   MRN: [id number]  DOS:  01/24/2014 Type of  visit: Acute visit Woke up this morning w/ the right conjunctiva slt red, minimal discomfort, no real pain. This afternoon it looks better.  ROS Denies any visual disturbances. No itching or eye discharge. No photophobia No recent cough  Past Medical History  Diagnosis Date  . Allergy   . Urolithiasis     in the 80s    Past Surgical History  Procedure Laterality Date  . Appendectomy    . Vasectomy      History   Social History  . Marital Status: Married    Spouse Name: N/A    Number of Children: 4  . Years of Education: N/A   Occupational History  . programer     Social History Main Topics  . Smoking status: Never Smoker   . Smokeless tobacco: Never Used  . Alcohol Use: Yes     Comment: very rare   . Drug Use: No  . Sexual Activity: Not on file   Other Topics Concern  . Not on file   Social History Narrative   Original from [place] moved to [place] after retirement ~ 2011   Married 38 years Stanton Kidney)   1 daughter, 3 sons               Medication List       This list is accurate as of: 01/24/14  5:59 PM.  Always use your most recent med list.               fexofenadine 180 MG tablet  Commonly known as:  ALLEGRA  Take 180 mg by mouth daily.     GENTAMICIN FORTIFIED OPHTHALMIC SOLUTION  4 drops to the  right eye 4 times a day for 5 days     multivitamin with minerals Tabs tablet  Take 1 tablet by mouth daily.           Objective:   Physical Exam BP 131/77  Pulse 84  Temp(Src) 98.4 F (36.9 C) (Oral)  Wt 176 lb 6.4 oz (80.015 kg)  SpO2 97% General -- alert, well-developed, NAD.  Eyes: L eye --normal R eye-- conjuntiva slightly red, no discharge, anterior chamber normal, no photophobia EOMI, PERLA   Psych-- Cognition and judgment appear intact. Cooperative with normal attention span and concentration. No  anxious or depressed appearing.        Assessment & Plan:   Conjunctivitis, Findings consistent with conjunctivitis, see instructions  Also, needs to contact them our GI for colonoscopy. Phone number provided.

## 2014-01-24 NOTE — Progress Notes (Signed)
Pre visit review using our clinic review tool, if applicable. No additional management support is needed unless otherwise documented below in the visit note. 

## 2014-01-24 NOTE — Patient Instructions (Signed)
Use the eyedrops as prescribed Okay to use over-the-counter artificial tears as needed Cold compress if needed Return to the office or to a urgent care if symptoms severe or not improving within the next week   [facility] [phone]     Conjunctivitis Conjunctivitis is commonly called "pink eye." Conjunctivitis can be caused by bacterial or viral infection, allergies, or injuries. There is usually redness of the lining of the eye, itching, discomfort, and sometimes discharge. There may be deposits of matter along the eyelids. A viral infection usually causes a watery discharge, while a bacterial infection causes a yellowish, thick discharge. Pink eye is very contagious and spreads by direct contact. You may be given antibiotic eyedrops as part of your treatment. Before using your eye medicine, remove all drainage from the eye by washing gently with warm water and cotton balls. Continue to use the medication until you have awakened 2 mornings in a row without discharge from the eye. Do not rub your eye. This increases the irritation and helps spread infection. Use separate towels from other household members. Wash your hands with soap and water before and after touching your eyes. Use cold compresses to reduce pain and sunglasses to relieve irritation from light. Do not wear contact lenses or wear eye makeup until the infection is gone. SEEK MEDICAL CARE IF:   Your symptoms are not better after 3 days of treatment.  You have increased pain or trouble seeing.  The outer eyelids become very red or swollen. Document Released: 10/09/2004 Document Revised: 11/24/2011 Document Reviewed: 09/01/2005 [facility] Patient Information 2014 [place].

## 2014-02-17 ENCOUNTER — Encounter: Payer: Self-pay | Admitting: Internal Medicine

## 2014-02-27 ENCOUNTER — Telehealth: Payer: Self-pay | Admitting: *Deleted

## 2014-02-27 ENCOUNTER — Ambulatory Visit (AMBULATORY_SURGERY_CENTER): Payer: Self-pay | Admitting: *Deleted

## 2014-02-27 VITALS — Ht 66.0 in | Wt 174.2 lb

## 2014-02-27 DIAGNOSIS — Z800 Family history of malignant neoplasm of digestive organs: Secondary | ICD-10-CM

## 2014-02-27 MED ORDER — NA SULFATE-K SULFATE-MG SULF 17.5-3.13-1.6 GM/177ML PO SOLN: ORAL | Status: DC

## 2014-02-27 NOTE — Progress Notes (Signed)
Patient denies any allergies to eggs or soy. Patient denies any problems with anesthesia/sedation. Patient denies any oxygen use at home and does not take any diet/weight loss medications. EMMI education assisgned to patient on colonoscopy, this was explained and instructions given to patient. Patient had colonoscopy in [place] about 8 years ago, polyps removed. Patient does have family hx colon cancer(father age 62's). Also patient states 1 year ago on vacation in [place] patient had blood in stool, saw MD but was not treated and cleared up in 1 1/2 days. No blood in stool since then. Release of information signed and patient will call me back with name of MD. Explained to patient that colonoscopy will be cancelled if he does not call me back, he verbalizes understanding.

## 2014-02-27 NOTE — Telephone Encounter (Signed)
Patient is direct for colonoscopy on 03/14/14 with Dr.Gessner. Patient states last colonoscopy was 8 years ago in [place], had polyps removed. Patient called the [facility] office but they do not keep records over 7 years, but said copy should have been sent to his PCP. Patient gave office name "[facility]" fax # [phone].  Patient denies any GI concerns, patient does have family HX colon cancer in father (age 62's). Patient's father had colon sx for cancer and also has hx colon polyps. Release of information formed signed and given to P.J., CMA.

## 2014-02-27 NOTE — Telephone Encounter (Signed)
Faxed ROI to [facility] # [phone].

## 2014-02-28 NOTE — Telephone Encounter (Signed)
Re-faxed ROI to [facility] at same fax # as [facility], per their medical records dept. The colonoscopy's are done at the hospital so that name needed to be written on the ROI.

## 2014-03-01 ENCOUNTER — Telehealth: Payer: Self-pay | Admitting: Internal Medicine

## 2014-03-01 ENCOUNTER — Ambulatory Visit (INDEPENDENT_AMBULATORY_CARE_PROVIDER_SITE_OTHER): Payer: Managed Care, Other (non HMO) | Admitting: Nurse Practitioner

## 2014-03-01 ENCOUNTER — Encounter: Payer: Self-pay | Admitting: Nurse Practitioner

## 2014-03-01 VITALS — BP 130/80 | HR 73 | Temp 98.4°F | Ht 66.0 in | Wt 176.0 lb

## 2014-03-01 DIAGNOSIS — L255 Unspecified contact dermatitis due to plants, except food: Secondary | ICD-10-CM

## 2014-03-01 MED ORDER — METHYLPREDNISOLONE ACETATE 40 MG/ML IJ SUSP
40.0000 mg | Freq: Once | INTRAMUSCULAR | Status: AC
  Administered 2014-03-01: 40 mg via INTRAMUSCULAR

## 2014-03-01 MED ORDER — PREDNISONE 10 MG PO TABS: ORAL_TABLET | ORAL | Status: DC

## 2014-03-01 NOTE — Telephone Encounter (Signed)
Caller name: Justin Murphy  Call back number:[phone]   Reason for call:  Pt states that he has had poison ivy for a week and it is still spreading.  Eyes are now getting a little puffy.  Pt has been using a OTC hydrocortisone cream.  Besides coming in, pt wants to know what he could possibly do.  Please advise.

## 2014-03-01 NOTE — Progress Notes (Signed)
   Subjective:    Patient ID: Justin Murphy, male    DOB: 1951-05-01, 63 y.o.   MRN: [id number]  Poison Karlene Einstein This is a new problem. The current episode started in the past 7 days. The problem has been gradually worsening since onset. The affected locations include the left arm, right arm, left upper leg, left lower leg and right lower leg. The rash is characterized by blistering, itchiness, redness and swelling. He was exposed to plant contact. Associated symptoms include facial edema (mild eye lid puffiness). Pertinent negatives include no congestion, cough, fatigue, fever, shortness of breath or sore throat. Past treatments include topical steroids (1% hydrocortisone). The treatment provided no relief.      Review of Systems  Constitutional: Negative for fever and fatigue.  HENT: Negative for congestion and sore throat.   Eyes: Negative for redness and itching.       Pt reports puffy lids   Respiratory: Negative for cough, chest tightness, shortness of breath and wheezing.   Skin: Positive for rash.       Objective:   Physical Exam  Constitutional: He is oriented to person, place, and time. He appears well-developed and well-nourished. No distress.  HENT:  Head: Normocephalic and atraumatic.  Eyes: Conjunctivae are normal. Right eye exhibits no discharge. Left eye exhibits no discharge.  Lids appear normal.  Pulmonary/Chest: Effort normal and breath sounds normal. No respiratory distress. He has no wheezes. He has no rales.  Neurological: He is alert and oriented to person, place, and time.  Skin: Skin is warm and dry.  Blisters range in sz from 53mm to 1cm. bilat arms & LE. Pt reports rash on L upper thigh.   Psychiatric: He has a normal mood and affect. His behavior is normal. Thought content normal.          Assessment & Plan:  1. Plant dermatitis - methylPREDNISolone acetate (DEPO-MEDROL) injection 40 mg; Inject 1 mL (40 mg total) into the muscle once. - predniSONE  (DELTASONE) 10 MG tablet; Starting 10/27/13, Take 4Tpo qam X 2d, then 3T po qam X 3d, then 2T po qd X 3d, then 1T po qam X 3d.  Dispense: 26 tablet; Refill: 0 Continue allegra. See pt instructions.

## 2014-03-01 NOTE — Patient Instructions (Addendum)
Start prednisone tomorrow. Take early in day so it will not keep you awake. Use ice packs to calm itching. Also, you may apply topical benadryl. Skin care: wash with mild soap daily. Let us know if eyes become irritated or you see lesions on lids.  Poison [employer] ivy is a inflammation of the skin (contact dermatitis) caused by touching the allergens on the leaves of the ivy plant following previous exposure to the plant. The rash usually appears 48 hours after exposure. The rash is usually bumps (papules) or blisters (vesicles) in a linear pattern. Depending on your own sensitivity, the rash may simply cause redness and itching, or it may also progress to blisters which may break open. These must be well cared for to prevent secondary bacterial (germ) infection, followed by scarring. Keep any open areas dry, clean, dressed, and covered with an antibacterial ointment if needed. The eyes may also get puffy. The puffiness is worst in the morning and gets better as the day progresses. This dermatitis usually heals without scarring, within 2 to 3 weeks without treatment. HOME CARE INSTRUCTIONS  Thoroughly wash with soap and water as soon as you have been exposed to poison ivy. You have about one half hour to remove the plant resin before it will cause the rash. This washing will destroy the oil or antigen on the skin that is causing, or will cause, the rash. Be sure to wash under your fingernails as any plant resin there will continue to spread the rash. Do not rub skin vigorously when washing affected area. Poison ivy cannot spread if no oil from the plant remains on your body. A rash that has progressed to weeping sores will not spread the rash unless you have not washed thoroughly. It is also important to wash any clothes you have been wearing as these may carry active allergens. The rash will return if you wear the unwashed clothing, even several days later. Avoidance of the plant in the future is the best  measure. Poison ivy plant can be recognized by the number of leaves. Generally, poison ivy has three leaves with flowering branches on a single stem. Diphenhydramine may be purchased over the counter and used as needed for itching. Do not drive with this medication if it makes you drowsy.Ask your caregiver about medication for children. SEEK MEDICAL CARE IF:  Open sores develop.  Redness spreads beyond area of rash.  You notice purulent (pus-like) discharge.  You have increased pain.  Other signs of infection develop (such as fever). Document Released: 08/29/2000 Document Revised: 11/24/2011 Document Reviewed: 07/18/2009 [facility] Patient Information 2015 [place], [place]. This information is not intended to replace advice given to you by your health care Jannette Cotham. Make sure you discuss any questions you have with your health care Eutimio Gharibian.

## 2014-03-01 NOTE — Progress Notes (Signed)
Pre visit review using our clinic review tool, if applicable. No additional management support is needed unless otherwise documented below in the visit note. 

## 2014-03-01 NOTE — Telephone Encounter (Signed)
Records put on Dr. Celesta Aver desk to review.

## 2014-03-01 NOTE — Telephone Encounter (Signed)
Spoke with patient who states he has had poison ivy for over 10 days and feels it is continuing to spread. Appt made today at 1:30 at [place] office with Nicky Pugh

## 2014-03-02 ENCOUNTER — Encounter: Payer: Self-pay | Admitting: Internal Medicine

## 2014-03-09 ENCOUNTER — Encounter: Payer: Self-pay | Admitting: Internal Medicine

## 2014-03-13 ENCOUNTER — Encounter: Payer: Self-pay | Admitting: Internal Medicine

## 2014-03-13 DIAGNOSIS — Z800 Family history of malignant neoplasm of digestive organs: Secondary | ICD-10-CM | POA: Insufficient documentation

## 2014-03-14 ENCOUNTER — Ambulatory Visit (AMBULATORY_SURGERY_CENTER): Payer: Managed Care, Other (non HMO) | Admitting: Internal Medicine

## 2014-03-14 ENCOUNTER — Encounter: Payer: Self-pay | Admitting: Internal Medicine

## 2014-03-14 VITALS — BP 138/85 | HR 69 | Temp 97.3°F | Resp 18 | Ht 66.0 in | Wt 175.0 lb

## 2014-03-14 DIAGNOSIS — Z860100 Personal history of colon polyps, unspecified: Secondary | ICD-10-CM

## 2014-03-14 DIAGNOSIS — D126 Benign neoplasm of colon, unspecified: Secondary | ICD-10-CM

## 2014-03-14 DIAGNOSIS — Z800 Family history of malignant neoplasm of digestive organs: Secondary | ICD-10-CM

## 2014-03-14 HISTORY — DX: Personal history of colonic polyps: Z86.010

## 2014-03-14 HISTORY — DX: Personal history of colon polyps, unspecified: Z86.0100

## 2014-03-14 MED ORDER — SODIUM CHLORIDE 0.9 % IV SOLN: 500.0000 mL | INTRAVENOUS | Status: DC

## 2014-03-14 NOTE — Progress Notes (Signed)
Called to room to assist during endoscopic procedure.  Patient ID and intended procedure confirmed with present staff. Received instructions for my participation in the procedure from the performing physician.  

## 2014-03-14 NOTE — Op Note (Signed)
[place]  [address] [place], [address]   COLONOSCOPY PROCEDURE REPORT  PATIENT: Justin Murphy, Justin Murphy  MR#: [id number] BIRTHDATE: September 25, 1950 , 62  yrs. old GENDER: Male ENDOSCOPIST: Gatha Mayer, MD, [facility] REFERRED NA:TFTD Larose Kells, M.D. PROCEDURE DATE:  03/14/2014 PROCEDURE:   Colonoscopy with snare polypectomy First Screening Colonoscopy - Avg.  risk and is 50 yrs.  old or older - No.  Prior Negative Screening - Now for repeat screening. 10 or more years since last screening  History of Adenoma - Now for follow-up colonoscopy & has been > or = to 3 yrs.  N/A  Polyps Removed Today? Yes. ASA CLASS:   Class II INDICATIONS:elevated risk screening and Patient's immediate family history of colon cancer. MEDICATIONS: propofol (Diprivan) 200mg  IV, MAC sedation, administered by CRNA, and These medications were titrated to patient response per physician's verbal order  DESCRIPTION OF PROCEDURE:   After the risks benefits and alternatives of the procedure were thoroughly explained, informed consent was obtained.  A digital rectal exam revealed no abnormalities of the rectum, A digital rectal exam revealed no prostatic nodules, and A digital rectal exam revealed the prostate was not enlarged.   The LB [id number]  endoscope was introduced through the anus and advanced to the cecum, which was identified by both the appendix and ileocecal valve. No adverse events experienced.   The quality of the prep was excellent using Suprep  The instrument was then slowly withdrawn as the colon was fully examined.  COLON FINDINGS: Three sessile polyps measuring 4, 5 and 7 mm in size were found in the ascending colon, transverse colon, and sigmoid colon.  A polypectomy was performed with a cold snare.  The resection was complete and the polyp tissue was completely retrieved.   The colon mucosa was otherwise normal.   A right colon retroflexion was performed.  Retroflexed views  revealed no abnormalities. The time to cecum=1 minutes 02 seconds.  Withdrawal time=10 minutes 41 seconds.  The scope was withdrawn and the procedure completed. COMPLICATIONS: There were no complications.  ENDOSCOPIC IMPRESSION: 1.   Three sessile polyps measuring 4, 5 and 7 mm in size were found in the ascending colon, transverse colon, and sigmoid colon; polypectomy was performed with a cold snare 2.   The colon mucosa was otherwise normal - excellent prep - FHx CRCA father <60  RECOMMENDATIONS: Timing of repeat colonoscopy will be determined by pathology findings.  eSigned:  Gatha Mayer, MD, [facility] 03/14/2014 12:02 PM   cc: Kathlene November, MD and The Patient

## 2014-03-14 NOTE — Patient Instructions (Addendum)
I found and removed 3 small polyps today. They all look benign.  This may mean repeating a colonoscopy in 3 years.  I will let you know pathology results and when to have another routine colonoscopy by mail.  I appreciate the opportunity to care for you. Gatha Mayer, MD, FACG   YOU HAD AN ENDOSCOPIC PROCEDURE TODAY AT [facility]: Refer to the procedure report that was given to you for any specific questions about what was found during the examination.  If the procedure report does not answer your questions, please call your gastroenterologist to clarify.  If you requested that your care partner not be given the details of your procedure findings, then the procedure report has been included in a sealed envelope for you to review at your convenience later.  YOU SHOULD EXPECT: Some feelings of bloating in the abdomen. Passage of more gas than usual.  Walking can help get rid of the air that was put into your GI tract during the procedure and reduce the bloating. If you had a lower endoscopy (such as a colonoscopy or flexible sigmoidoscopy) you may notice spotting of blood in your stool or on the toilet paper. If you underwent a bowel prep for your procedure, then you may not have a normal bowel movement for a few days.  DIET: Your first meal following the procedure should be a light meal and then it is ok to progress to your normal diet.  A half-sandwich or bowl of soup is an example of a good first meal.  Heavy or fried foods are harder to digest and may make you feel nauseous or bloated.  Likewise meals heavy in dairy and vegetables can cause extra gas to form and this can also increase the bloating.  Drink plenty of fluids but you should avoid alcoholic beverages for 24 hours.  ACTIVITY: Your care partner should take you home directly after the procedure.  You should plan to take it easy, moving slowly for the rest of the day.  You can resume normal activity the day after the  procedure however you should NOT DRIVE or use heavy machinery for 24 hours (because of the sedation medicines used during the test).    SYMPTOMS TO REPORT IMMEDIATELY: A gastroenterologist can be reached at any hour.  During normal business hours, 8:30 AM to 5:00 PM Monday through Friday, call [phone].  After hours and on weekends, please call the GI answering service at [phone] who will take a message and have the physician on call contact you.   Following lower endoscopy (colonoscopy or flexible sigmoidoscopy):  Excessive amounts of blood in the stool  Significant tenderness or worsening of abdominal pains  Swelling of the abdomen that is new, acute  Fever of 100F or higher  FOLLOW UP: If any biopsies were taken you will be contacted by phone or by letter within the next 1-3 weeks.  Call your gastroenterologist if you have not heard about the biopsies in 3 weeks.  Our staff will call the home number listed on your records the next business day following your procedure to check on you and address any questions or concerns that you may have at that time regarding the information given to you following your procedure. This is a courtesy call and so if there is no answer at the home number and we have not heard from you through the emergency physician on call, we will assume that you have returned to your  regular daily activities without incident.  SIGNATURES/CONFIDENTIALITY: You and/or your care partner have signed paperwork which will be entered into your electronic medical record.  These signatures attest to the fact that that the information above on your After Visit Summary has been reviewed and is understood.  Full responsibility of the confidentiality of this discharge information lies with you and/or your care-partner.

## 2014-03-14 NOTE — Progress Notes (Signed)
A/ox3 pleased with MAC, report to Karen RN 

## 2014-03-15 ENCOUNTER — Telehealth: Payer: Self-pay | Admitting: *Deleted

## 2014-03-15 NOTE — Telephone Encounter (Signed)
  Follow up Call-  Call back number 03/14/2014  Post procedure Call Back phone  # [phone]  Permission to leave phone message Yes     Patient questions:  Do you have a fever, pain , or abdominal swelling? No. Pain Score  0 *  Have you tolerated food without any problems? Yes.    Have you been able to return to your normal activities? Yes.    Do you have any questions about your discharge instructions: Diet   No. Medications  No. Follow up visit  No.  Do you have questions or concerns about your Care? No.  Actions: * If pain score is 4 or above: No action needed, pain <4.

## 2014-03-24 ENCOUNTER — Encounter: Payer: Self-pay | Admitting: Internal Medicine

## 2014-03-24 NOTE — Progress Notes (Signed)
Quick Note:  3 adenomas max 7 mm + FHx CRCA Repeat colon 2018 ______

## 2014-08-08 ENCOUNTER — Telehealth: Payer: Self-pay | Admitting: Internal Medicine

## 2014-08-08 ENCOUNTER — Ambulatory Visit (INDEPENDENT_AMBULATORY_CARE_PROVIDER_SITE_OTHER): Payer: Managed Care, Other (non HMO) | Admitting: Physician Assistant

## 2014-08-08 ENCOUNTER — Encounter: Payer: Self-pay | Admitting: Physician Assistant

## 2014-08-08 ENCOUNTER — Telehealth: Payer: Self-pay | Admitting: Physician Assistant

## 2014-08-08 VITALS — BP 128/72 | HR 82 | Temp 97.8°F | Resp 16 | Wt 179.0 lb

## 2014-08-08 DIAGNOSIS — H60399 Other infective otitis externa, unspecified ear: Secondary | ICD-10-CM | POA: Insufficient documentation

## 2014-08-08 DIAGNOSIS — H60392 Other infective otitis externa, left ear: Secondary | ICD-10-CM

## 2014-08-08 DIAGNOSIS — H65192 Other acute nonsuppurative otitis media, left ear: Secondary | ICD-10-CM | POA: Insufficient documentation

## 2014-08-08 DIAGNOSIS — B029 Zoster without complications: Secondary | ICD-10-CM

## 2014-08-08 MED ORDER — CIPROFLOXACIN-DEXAMETHASONE 0.3-0.1 % OT SUSP: OTIC | Status: DC

## 2014-08-08 MED ORDER — VALACYCLOVIR HCL 1 G PO TABS: 1000.0000 mg | ORAL_TABLET | Freq: Three times a day (TID) | ORAL | Status: DC

## 2014-08-08 MED ORDER — AMOXICILLIN 875 MG PO TABS: 875.0000 mg | ORAL_TABLET | Freq: Two times a day (BID) | ORAL | Status: DC

## 2014-08-08 NOTE — Telephone Encounter (Signed)
Spoke with pt who wanted to know the difference between nasonex and flonase. Advised pt mostly just cost. Also advised how to use medication.

## 2014-08-08 NOTE — Assessment & Plan Note (Signed)
Moderate swelling and erythema. Rx Ciprodex to apply as directed. Place cotton in ears while showering.  Return precautions discussed with patient.

## 2014-08-08 NOTE — Patient Instructions (Addendum)
Please use antibiotics as directed.  Increase fluid intake.  Rest.  Continue Allegra and add a daily Flonase.  Symptoms should improve pretty quickly with this regimen.  Avoid getting fluid in your ear while showering (cotoon balls).  Follow-up if symptoms are not resolving.  Otitis Media Otitis media is redness, soreness, and inflammation of the middle ear. Otitis media may be caused by allergies or, most commonly, by infection. Often it occurs as a complication of the common cold. SIGNS AND SYMPTOMS Symptoms of otitis media may include:  Earache.  Fever.  Ringing in your ear.  Headache.  Leakage of fluid from the ear. DIAGNOSIS To diagnose otitis media, your health care provider will examine your ear with an otoscope. This is an instrument that allows your health care provider to see into your ear in order to examine your eardrum. Your health care provider also will ask you questions about your symptoms. TREATMENT  Typically, otitis media resolves on its own within 3-5 days. Your health care provider may prescribe medicine to ease your symptoms of pain. If otitis media does not resolve within 5 days or is recurrent, your health care provider may prescribe antibiotic medicines if he or she suspects that a bacterial infection is the cause. HOME CARE INSTRUCTIONS   If you were prescribed an antibiotic medicine, finish it all even if you start to feel better.  Take medicines only as directed by your health care provider.  Keep all follow-up visits as directed by your health care provider. SEEK MEDICAL CARE IF:  You have otitis media only in one ear, or bleeding from your nose, or both.  You notice a lump on your neck.  You are not getting better in 3-5 days.  You feel worse instead of better. SEEK IMMEDIATE MEDICAL CARE IF:   You have pain that is not controlled with medicine.  You have swelling, redness, or pain around your ear or stiffness in your neck.  You notice that part  of your face is paralyzed.  You notice that the bone behind your ear (mastoid) is tender when you touch it. MAKE SURE YOU:   Understand these instructions.  Will watch your condition.  Will get help right away if you are not doing well or get worse. Document Released: 06/06/2004 Document Revised: 01/16/2014 Document Reviewed: 03/29/2013 [facility] Patient Information 2015 [place], [place]. This information is not intended to replace advice given to you by your health care provider. Make sure you discuss any questions you have with your health care provider.  Otitis Externa Otitis externa is a bacterial or fungal infection of the outer ear canal. This is the area from the eardrum to the outside of the ear. Otitis externa is sometimes called "swimmer's ear." CAUSES  Possible causes of infection include:  Swimming in dirty water.  Moisture remaining in the ear after swimming or bathing.  Mild injury (trauma) to the ear.  Objects stuck in the ear (foreign body).  Cuts or scrapes (abrasions) on the outside of the ear. SIGNS AND SYMPTOMS  The first symptom of infection is often itching in the ear canal. Later signs and symptoms may include swelling and redness of the ear canal, ear pain, and yellowish-white fluid (pus) coming from the ear. The ear pain may be worse when pulling on the earlobe. DIAGNOSIS  Your health care provider will perform a physical exam. A sample of fluid may be taken from the ear and examined for bacteria or fungi. TREATMENT  Antibiotic ear drops  are often given for 10 to 14 days. Treatment may also include pain medicine or corticosteroids to reduce itching and swelling. HOME CARE INSTRUCTIONS   Apply antibiotic ear drops to the ear canal as prescribed by your health care provider.  Take medicines only as directed by your health care provider.  If you have diabetes, follow any additional treatment instructions from your health care provider.  Keep all follow-up  visits as directed by your health care provider. PREVENTION   Keep your ear dry. Use the corner of a towel to absorb water out of the ear canal after swimming or bathing.  Avoid scratching or putting objects inside your ear. This can damage the ear canal or remove the protective wax that lines the canal. This makes it easier for bacteria and fungi to grow.  Avoid swimming in lakes, polluted water, or poorly chlorinated pools.  You may use ear drops made of rubbing alcohol and vinegar after swimming. Combine equal parts of white vinegar and alcohol in a bottle. Put 3 or 4 drops into each ear after swimming. SEEK MEDICAL CARE IF:   You have a fever.  Your ear is still red, swollen, painful, or draining pus after 3 days.  Your redness, swelling, or pain gets worse.  You have a severe headache.  You have redness, swelling, pain, or tenderness in the area behind your ear. MAKE SURE YOU:   Understand these instructions.  Will watch your condition.  Will get help right away if you are not doing well or get worse. Document Released: 09/01/2005 Document Revised: 01/16/2014 Document Reviewed: 09/18/2011 [facility] Patient Information 2015 [place], [place]. This information is not intended to replace advice given to you by your health care provider. Make sure you discuss any questions you have with your health care provider.

## 2014-08-08 NOTE — Progress Notes (Signed)
Pre visit review using our clinic review tool, if applicable. No additional management support is needed unless otherwise documented below in the visit note. 

## 2014-08-08 NOTE — Telephone Encounter (Signed)
Pt in need of clinical advice pt would like to know if nexum is better then flonase. Please advise

## 2014-08-08 NOTE — Progress Notes (Signed)
Patient presents to clinic today c/o left ear pain x 1 week.  Pain is aching and constant.  Has been worsening gradually.  Patient denies fever, chills, cough.  Endorses some sinus pressure.  Denies drainage from ear, tinnitus or decreased hearing.  Endorse swollen glands of left side of face and neck.  Past Medical History  Diagnosis Date  . Allergy   . Urolithiasis     in the 66s  . TMJ syndrome   . Hypercholesteremia   . Personal history of colonic polyps - adenomas 03/14/2014    Current Outpatient Prescriptions on File Prior to Visit  Medication Sig Dispense Refill  . fexofenadine (ALLEGRA) 180 MG tablet Take 180 mg by mouth daily.    . Multiple Vitamin (MULTIVITAMIN WITH MINERALS) TABS tablet Take 1 tablet by mouth daily.    . Omega-3 Fatty Acids (OMEGA 3 PO) Take 1 tablet by mouth 2 (two) times a week.    . Probiotic Product (PROBIOTIC DAILY PO) Take 1 tablet by mouth 2 (two) times a week.     No current facility-administered medications on file prior to visit.    No Known Allergies  Family History  Problem Relation Age of Onset  . Alzheimer's disease Mother   . Colon cancer Father     dx early 72s  . Colon polyps Father   . Prostate cancer Neg Hx   . Heart disease Neg Hx   . Diabetes Other     uncle    History   Social History  . Marital Status: Married    Spouse Name: N/A    Number of Children: 4  . Years of Education: N/A   Occupational History  . programer     Social History Main Topics  . Smoking status: Never Smoker   . Smokeless tobacco: Never Used  . Alcohol Use: Yes     Comment: very rare   . Drug Use: No  . Sexual Activity: None   Other Topics Concern  . None   Social History Narrative   Original from [place] moved to [place] after retirement ~ 2011   Married 46 years Stanton Kidney)   1 daughter, 3 sons          Review of Systems - See HPI.  All other ROS are negative.  BP 128/72 mmHg  Pulse 82  Temp(Src) 97.8 F (36.6 C) (Oral)  Resp 16   Wt 179 lb (81.194 kg)  SpO2 97%  Physical Exam  Constitutional: He is oriented to person, place, and time and well-developed, well-nourished, and in no distress.  HENT:  Head: Normocephalic and atraumatic.  Right Ear: Tympanic membrane, external ear and ear canal normal.  Left Ear: There is swelling. Tympanic membrane is erythematous and bulging.  Nose: Nose normal. Right sinus exhibits no maxillary sinus tenderness and no frontal sinus tenderness. Left sinus exhibits no maxillary sinus tenderness and no frontal sinus tenderness.  Mouth/Throat: Uvula is midline and oropharynx is clear and moist.  Eyes: Conjunctivae are normal. Pupils are equal, round, and reactive to light.  Neck: Neck supple.  Cardiovascular: Normal rate, regular rhythm, normal heart sounds and intact distal pulses.   Pulmonary/Chest: Effort normal and breath sounds normal. No respiratory distress. He has no wheezes. He has no rales. He exhibits no tenderness.  Lymphadenopathy:       Head (left side): Preauricular and posterior auricular adenopathy present.  Neurological: He is alert and oriented to person, place, and time.  Skin: Skin is warm  and dry. No rash noted.  Psychiatric: Affect normal.   Assessment/Plan: Acute nonsuppurative otitis media of left ear Rx Amoxicillin BID x 10 days.  Take with food.  Continue Allegra and begin daily Flonase.  Return precautions discussed with patient.  Infectious otitis externa Moderate swelling and erythema. Rx Ciprodex to apply as directed. Place cotton in ears while showering.  Return precautions discussed with patient.

## 2014-08-08 NOTE — Telephone Encounter (Signed)
Patient called with questions concerning a rash that has developed on left side of face. Patient was seen earlier today for otitis.  Treated accordingly.  Has not started medications yet. Had mentioned thought he was getting a mild cold sore although no lesion was noted at today's visit.  Now endorses blistering rash of left side of mouth and cheek.  Endorses associated burning and tingling.  Denies symptoms near eyes.  Denies change in vision.  Concern for herpes Zoster.  Valtrex 1000 mg TID x 7 days sent to pharmacy.  Patient transferred up to front desk to schedule follow-up appointment for tomorrow morning.  Discussed alarm signs and symptoms that would prompt need for ER evaluation (most specifically if rash spreads further or heads toward eye).

## 2014-08-08 NOTE — Assessment & Plan Note (Signed)
Rx Amoxicillin BID x 10 days.  Take with food.  Continue Allegra and begin daily Flonase.  Return precautions discussed with patient.

## 2014-08-09 ENCOUNTER — Encounter: Payer: Self-pay | Admitting: Medical

## 2014-08-09 ENCOUNTER — Ambulatory Visit (INDEPENDENT_AMBULATORY_CARE_PROVIDER_SITE_OTHER): Payer: Managed Care, Other (non HMO) | Admitting: Medical

## 2014-08-09 VITALS — BP 136/87 | HR 70 | Temp 98.2°F | Ht 67.0 in | Wt 179.4 lb

## 2014-08-09 DIAGNOSIS — B029 Zoster without complications: Secondary | ICD-10-CM | POA: Insufficient documentation

## 2014-08-09 MED ORDER — TRAMADOL HCL 50 MG PO TABS: 50.0000 mg | ORAL_TABLET | Freq: Four times a day (QID) | ORAL | Status: DC | PRN

## 2014-08-09 NOTE — Progress Notes (Signed)
Pre visit review using our clinic review tool, if applicable. No additional management support is needed unless otherwise documented below in the visit note. 

## 2014-08-09 NOTE — Patient Instructions (Addendum)
Your do appear to have shingles. Start the valtrex and take it until done. If you have any rash spreading toward your eye or vesicles spreading toward the eye then ED evaluation. Also go to ED for any vision changes or eye pain. I do want you to follow up on Monday with me.  I will give you tramadol to help with pain from shingles.  Shingles Shingles (herpes zoster) is an infection that is caused by the same virus that causes chickenpox (varicella). The infection causes a painful skin rash and fluid-filled blisters, which eventually break open, crust over, and heal. It may occur in any area of the body, but it usually affects only one side of the body or face. The pain of shingles usually lasts about 1 month. However, some people with shingles may develop long-term (chronic) pain in the affected area of the body. Shingles often occurs many years after the person had chickenpox. It is more common:  In people older than 50 years.  In people with weakened immune systems, such as those with HIV, AIDS, or cancer.  In people taking medicines that weaken the immune system, such as transplant medicines.  In people under great stress. CAUSES  Shingles is caused by the varicella zoster virus (VZV), which also causes chickenpox. After a person is infected with the virus, it can remain in the person's body for years in an inactive state (dormant). To cause shingles, the virus reactivates and breaks out as an infection in a nerve root. The virus can be spread from person to person (contagious) through contact with open blisters of the shingles rash. It will only spread to people who have not had chickenpox. When these people are exposed to the virus, they may develop chickenpox. They will not develop shingles. Once the blisters scab over, the person is no longer contagious and cannot spread the virus to others. SIGNS AND SYMPTOMS  Shingles shows up in stages. The initial symptoms may be pain, itching, and  tingling in an area of the skin. This pain is usually described as burning, stabbing, or throbbing.In a few days or weeks, a painful red rash will appear in the area where the pain, itching, and tingling were felt. The rash is usually on one side of the body in a band or belt-like pattern. Then, the rash usually turns into fluid-filled blisters. They will scab over and dry up in approximately 2-3 weeks. Flu-like symptoms may also occur with the initial symptoms, the rash, or the blisters. These may include:  Fever.  Chills.  Headache.  Upset stomach. DIAGNOSIS  Your health care provider will perform a skin exam to diagnose shingles. Skin scrapings or fluid samples may also be taken from the blisters. This sample will be examined under a microscope or sent to a lab for further testing. TREATMENT  There is no specific cure for shingles. Your health care provider will likely prescribe medicines to help you manage the pain, recover faster, and avoid long-term problems. This may include antiviral drugs, anti-inflammatory drugs, and pain medicines. HOME CARE INSTRUCTIONS   Take a cool bath or apply cool compresses to the area of the rash or blisters as directed. This may help with the pain and itching.   Take medicines only as directed by your health care provider.   Rest as directed by your health care provider.  Keep your rash and blisters clean with mild soap and cool water or as directed by your health care provider.  Do not  pick your blisters or scratch your rash. Apply an anti-itch cream or numbing creams to the affected area as directed by your health care provider.  Keep your shingles rash covered with a loose bandage (dressing).  Avoid skin contact with:  Babies.   Pregnant women.   Children with eczema.   Elderly people with transplants.   People with chronic illnesses, such as leukemia or AIDS.   Wear loose-fitting clothing to help ease the pain of material  rubbing against the rash.  Keep all follow-up visits as directed by your health care provider.If the area involved is on your face, you may receive a referral for a specialist, such as an eye doctor (ophthalmologist) or an ear, nose, and throat (ENT) doctor. Keeping all follow-up visits will help you avoid eye problems, chronic pain, or disability.  SEEK IMMEDIATE MEDICAL CARE IF:   You have facial pain, pain around the eye area, or loss of feeling on one side of your face.  You have ear pain or ringing in your ear.  You have loss of taste.  Your pain is not relieved with prescribed medicines.   Your redness or swelling spreads.   You have more pain and swelling.  Your condition is worsening or has changed.   You have a fever. MAKE SURE YOU:  Understand these instructions.  Will watch your condition.  Will get help right away if you are not doing well or get worse. Document Released: 09/01/2005 Document Revised: 01/16/2014 Document Reviewed: 04/15/2012 [facility] Patient Information 2015 [place], [place]. This information is not intended to replace advice given to you by your health care provider. Make sure you discuss any questions you have with your health care provider. Just he followed instructions we watch and

## 2014-08-09 NOTE — Progress Notes (Signed)
Subjective:    Patient ID: Justin Murphy, male    DOB: January 06, 1951, 63 y.o.   MRN: [id number]  HPI   Pt was treated just yesterday in the morning for lt OE and OM. Cody saw. Pt states then yesterday afternoon. He got small blister midline under lip. Then subsequent rash up side of his face toward temporal area and into his hair lin. Slight burn sensation. No vision changes and no eye pain.  Past Medical History  Diagnosis Date  . Allergy   . Urolithiasis     in the 64s  . TMJ syndrome   . Hypercholesteremia   . Personal history of colonic polyps - adenomas 03/14/2014    History   Social History  . Marital Status: Married    Spouse Name: N/A    Number of Children: 4  . Years of Education: N/A   Occupational History  . programer     Social History Main Topics  . Smoking status: Never Smoker   . Smokeless tobacco: Never Used  . Alcohol Use: Yes     Comment: very rare   . Drug Use: No  . Sexual Activity: Not on file   Other Topics Concern  . Not on file   Social History Narrative   Original from [place] moved to [place] after retirement ~ 2011   Married 32 years Stanton Kidney)   1 daughter, 3 sons           Past Surgical History  Procedure Laterality Date  . Appendectomy    . Vasectomy    . Colonoscopy  2007    diminutive left hyperplastic polyp    Family History  Problem Relation Age of Onset  . Alzheimer's disease Mother   . Colon cancer Father     dx early 46s  . Colon polyps Father   . Prostate cancer Neg Hx   . Heart disease Neg Hx   . Diabetes Other     uncle    No Known Allergies  Current Outpatient Prescriptions on File Prior to Visit  Medication Sig Dispense Refill  . amoxicillin (AMOXIL) 875 MG tablet Take 1 tablet (875 mg total) by mouth 2 (two) times daily. 20 tablet 0  . ciprofloxacin-dexamethasone (CIPRODEX) otic suspension Apply 4 drops to the affected ear(s) twice daily x 7 days 7.5 mL 0  . fexofenadine (ALLEGRA) 180 MG tablet Take 180  mg by mouth daily.    . Multiple Vitamin (MULTIVITAMIN WITH MINERALS) TABS tablet Take 1 tablet by mouth daily.    . Omega-3 Fatty Acids (OMEGA 3 PO) Take 1 tablet by mouth 2 (two) times a week.    . Probiotic Product (PROBIOTIC DAILY PO) Take 1 tablet by mouth 2 (two) times a week.    . valACYclovir (VALTREX) 1000 MG tablet Take 1 tablet (1,000 mg total) by mouth 3 (three) times daily. 21 tablet 0   No current facility-administered medications on file prior to visit.    BP 136/87 mmHg  Pulse 70  Temp(Src) 98.2 F (36.8 C) (Oral)  Ht 5\' 7"  (1.702 m)  Wt 179 lb 6.4 oz (81.375 kg)  BMI 28.09 kg/m2  SpO2 98%     Review of Systems  Constitutional: Negative for fever, chills and fatigue.  HENT: Negative for congestion, ear discharge, ear pain, nosebleeds, postnasal drip, rhinorrhea, sinus pressure, sneezing, sore throat and trouble swallowing.   Eyes: Negative for photophobia, pain, discharge, redness, itching and visual disturbance.  Respiratory: Negative for cough, chest  tightness, shortness of breath and wheezing.   Cardiovascular: Negative for chest pain and palpitations.  Gastrointestinal: Negative for nausea, abdominal pain, diarrhea and rectal pain.  Musculoskeletal: Negative for back pain.  Skin:       Rash under his chin midline and running up his face towards his temporal region and scalp region. This area does burn and sting.  Neurological: Negative for dizziness, tremors, seizures, syncope, facial asymmetry, weakness, light-headedness, numbness and headaches.  Hematological: Negative for adenopathy. Does not bruise/bleed easily.       Objective:   Physical Exam   Gen-no acute distress Lungs -clear even and unlabored Heart -regular rate and rhythm HEENT-sinuses nontender to palpation, ear canals left canal is mild swollen. Right canal and TM is normal. Posterior pharynx exam is normal   skin-midline underneath his chin he has a small scab and he has a slight red rash  running up the left side of his cheek towards his temporal area and running into his scalp hairline. From his temporal region to his lateral aspect of the left eye there are no vesicles. Eye exam-he has no vesicles surrounding his eyes. Ophthalmoscope exam done without dilation shows no abnormality but his pupils were very reactive/constriced to light and exam was limited. Visual acuity of both eyes somewhat the same. See vision portion all remaining section.               Assessment & Plan:

## 2014-08-09 NOTE — Assessment & Plan Note (Signed)
Your do appear to have shingles. Start the valtrex and take it until done. If you have any rash spreading toward your eye or vesicles spreading toward the eye then ED evaluation. Also go to ED for any vision changes or eye pain. I do want you to follow up on Monday with me.  I will give you tramadol to help with pain from shingles.

## 2014-08-14 ENCOUNTER — Encounter: Payer: Self-pay | Admitting: Medical

## 2014-08-14 ENCOUNTER — Ambulatory Visit: Payer: Managed Care, Other (non HMO) | Admitting: Internal Medicine

## 2014-08-14 ENCOUNTER — Ambulatory Visit (INDEPENDENT_AMBULATORY_CARE_PROVIDER_SITE_OTHER): Payer: Managed Care, Other (non HMO) | Admitting: Medical

## 2014-08-14 VITALS — BP 152/92 | HR 77 | Temp 97.6°F | Ht 67.0 in | Wt 178.0 lb

## 2014-08-14 DIAGNOSIS — B029 Zoster without complications: Secondary | ICD-10-CM

## 2014-08-14 MED ORDER — VALACYCLOVIR HCL 1 G PO TABS: 1000.0000 mg | ORAL_TABLET | Freq: Three times a day (TID) | ORAL | Status: DC

## 2014-08-14 NOTE — Progress Notes (Signed)
Pre visit review using our clinic review tool, if applicable. No additional management support is needed unless otherwise documented below in the visit note. 

## 2014-08-14 NOTE — Patient Instructions (Addendum)
Your do appear to have persistent  Shingles but the area is improving. Will continue valtrex. I will give your 4 more days in addition to current rx since area is close to eye but not approaching the eye. If you have any rash spreading toward your eye or vesicles spreading toward the eye then ED evaluation. Also go to ED for any vision changes or eye pain. I do want you to follow up on Monday with me.  I will give you more tramadol to help with pain from shingles if needded.  Follow up 10 days or as needed

## 2014-08-14 NOTE — Progress Notes (Addendum)
Subjective:    Patient ID: Justin Murphy, male    DOB: 10-03-1950, 63 y.o.   MRN: [id number]  HPI   Pt states that he still gets occasional sharp pain every now and then rt side of his face. Feel like slight electric shock treatment. Pt did get some slight breakout of vesicle on his his lower mandible region. These area have scabbed over. Some pain in his ear. And some scabbing in left side of his head.  He has no lt eye pain or vision changes. Pain lt side of face is much improved.      Review of Systems   Subjective:    Patient ID: Justin Murphy, male    DOB: [birth year], 63 y.o.   MRN: [id number]  HPI   I deleted prior HPI form lst visit. Accidentally pulled forward. Today HPI above  Past Medical History  Diagnosis Date  . Allergy   . Urolithiasis     in the 23s  . TMJ syndrome   . Hypercholesteremia   . Personal history of colonic polyps - adenomas 03/14/2014    History   Social History  . Marital Status: Married    Spouse Name: N/A    Number of Children: 4  . Years of Education: N/A   Occupational History  . programer     Social History Main Topics  . Smoking status: Never Smoker   . Smokeless tobacco: Never Used  . Alcohol Use: Yes     Comment: very rare   . Drug Use: No  . Sexual Activity: Not on file   Other Topics Concern  . Not on file   Social History Narrative   Original from [place] moved to [place] after retirement ~ 2011   Married 9 years Stanton Kidney)   1 daughter, 3 sons           Past Surgical History  Procedure Laterality Date  . Appendectomy    . Vasectomy    . Colonoscopy  2007    diminutive left hyperplastic polyp    Family History  Problem Relation Age of Onset  . Alzheimer's disease Mother   . Colon cancer Father     dx early 77s  . Colon polyps Father   . Prostate cancer Neg Hx   . Heart disease Neg Hx   . Diabetes Other     uncle    No Known Allergies  Current Outpatient Prescriptions on File Prior to  Visit  Medication Sig Dispense Refill  . amoxicillin (AMOXIL) 875 MG tablet Take 1 tablet (875 mg total) by mouth 2 (two) times daily. 20 tablet 0  . ciprofloxacin-dexamethasone (CIPRODEX) otic suspension Apply 4 drops to the affected ear(s) twice daily x 7 days 7.5 mL 0  . fexofenadine (ALLEGRA) 180 MG tablet Take 180 mg by mouth daily.    . Multiple Vitamin (MULTIVITAMIN WITH MINERALS) TABS tablet Take 1 tablet by mouth daily.    . Omega-3 Fatty Acids (OMEGA 3 PO) Take 1 tablet by mouth 2 (two) times a week.    . Probiotic Product (PROBIOTIC DAILY PO) Take 1 tablet by mouth 2 (two) times a week.    . traMADol (ULTRAM) 50 MG tablet Take 1 tablet (50 mg total) by mouth every 6 (six) hours as needed for severe pain. 15 tablet 0  . valACYclovir (VALTREX) 1000 MG tablet Take 1 tablet (1,000 mg total) by mouth 3 (three) times daily. 21 tablet 0   No current facility-administered medications  on file prior to visit.    BP 152/92 mmHg  Pulse 77  Temp(Src) 97.6 F (36.4 C) (Oral)  Ht _0  (1.702 m)  Wt 178 lb (80.74 kg)  BMI 27.87 kg/m2  SpO2 98%     Review of Systems  Constitutional: Negative for fever, chills and fatigue.  HENT: Negative for congestion, ear discharge, ear pain, nosebleeds, postnasal drip, rhinorrhea, sinus pressure, sneezing, sore throat and trouble swallowing.   Eyes: Negative for photophobia, pain, discharge, redness, itching and visual disturbance.  Respiratory: Negative for cough, chest tightness, shortness of breath and wheezing.   Cardiovascular: Negative for chest pain and palpitations.  Gastrointestinal: Negative for nausea, abdominal pain, diarrhea and rectal pain.  Musculoskeletal: Negative for back pain.  Skin:       Rash under his chin midline and running up his face towards his temporal region and scalp region. This area does burn and sting. More scabbed over area now. Neurological: Negative for dizziness, tremors, seizures, syncope, facial asymmetry,  weakness, light-headedness, numbness and headaches.  Hematological: Negative for adenopathy. Does not bruise/bleed easily.       Objective:   Physical Exam   Gen-no acute distress Lungs -clear even and unlabored Heart -regular rate and rhythm HEENT-sinuses nontender to palpation, ear canals left canal is mild swollen. Right canal and TM is normal. Posterior pharynx exam is normal   skin-midline underneath his chin he has a 2-3 small scabs now and he has a slight red rash running up the left side of his cheek towards his temporal area and running into his scalp hairline. From his temporal region to his lateral aspect of the left eye there are no vesicles.   Pulled forward ophthalmoscope exam from last note but deleted this on addendum since I did not do today.              Assessment & Plan:       Objective:   Physical Exam see above physical exam.         Assessment & Plan:

## 2014-08-14 NOTE — Assessment & Plan Note (Signed)
Area are  improved. No current vesicle lesions. See avs. Note no lesions around his eyes.

## 2015-02-06 IMAGING — CT CT ABD-PELV W/O CM
2 of 4 series · 16 of 46 positions shown, 18 images · non-contrast
Comparison: None.

CLINICAL DATA: Greater than 1 week history of hematuria, lower
abdominal pain there is history of previous kidney stones,
vasectomy, and appendectomy

EXAM:
CT ABDOMEN AND PELVIS WITHOUT CONTRAST
TECHNIQUE: Multidetector CT imaging of the abdomen and pelvis was performed
following the standard protocol without intravenous contrast.

[Series 2: abd/pelvis 5.0 b31f · axial · 0.79mm/px · z∈[-383,+52]mm · 13 of 95 slices shown, 15 images]
[im 4/95  soft-tissue]
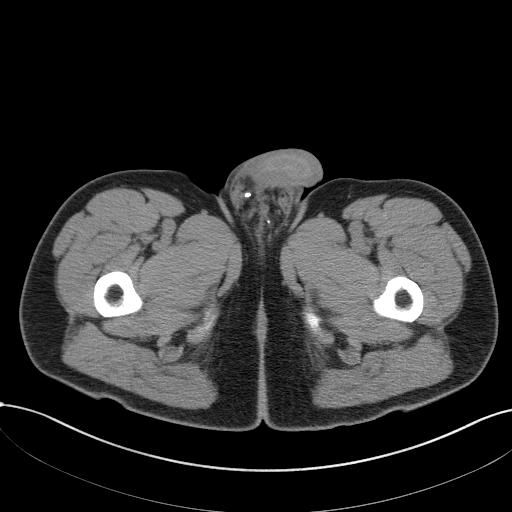
[im 4/95  bone]
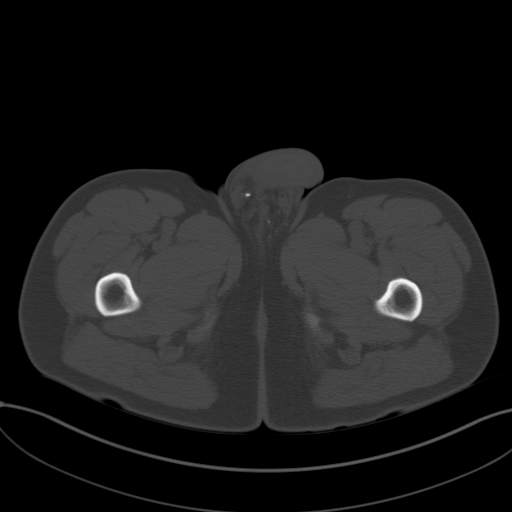
[im 11/95  soft-tissue]
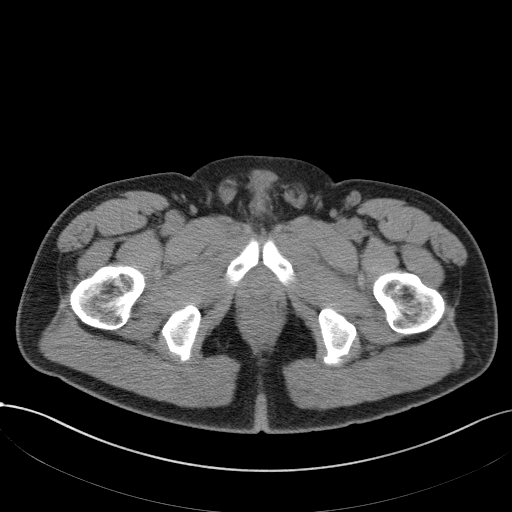
[im 19/95  soft-tissue]
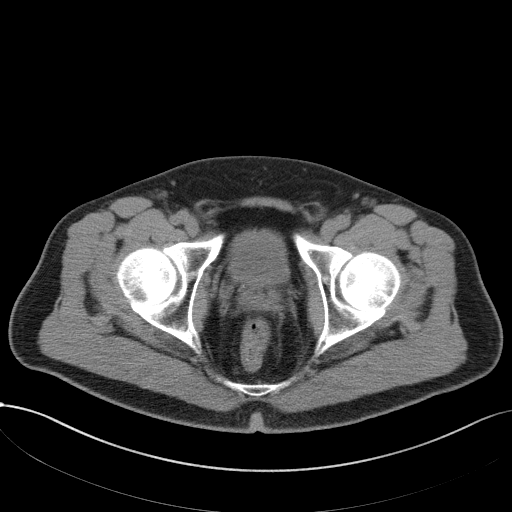
[im 26/95  soft-tissue]
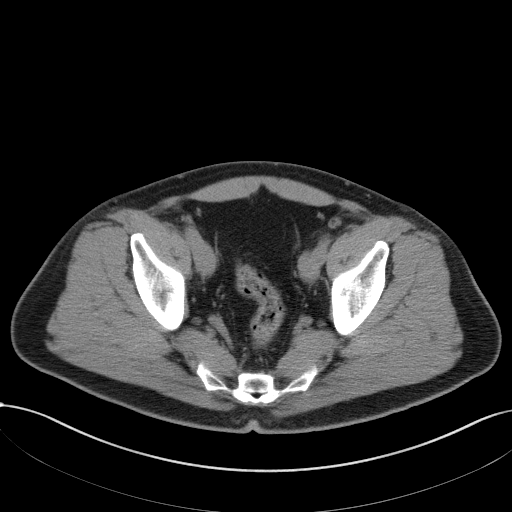
[im 33/95  soft-tissue]
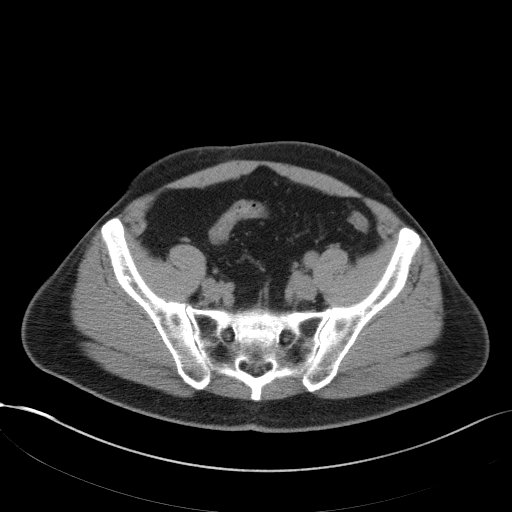
[im 40/95  soft-tissue]
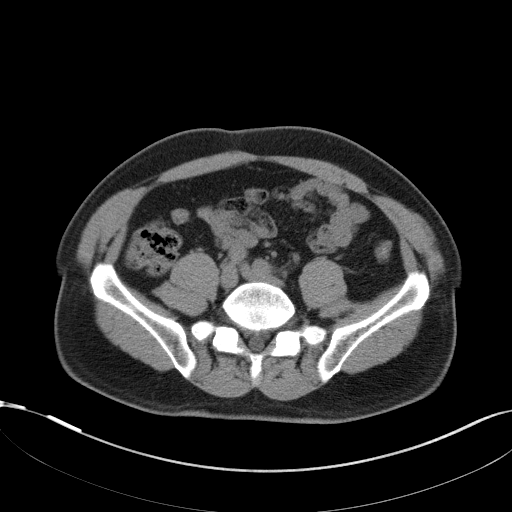
[im 48/95  soft-tissue]
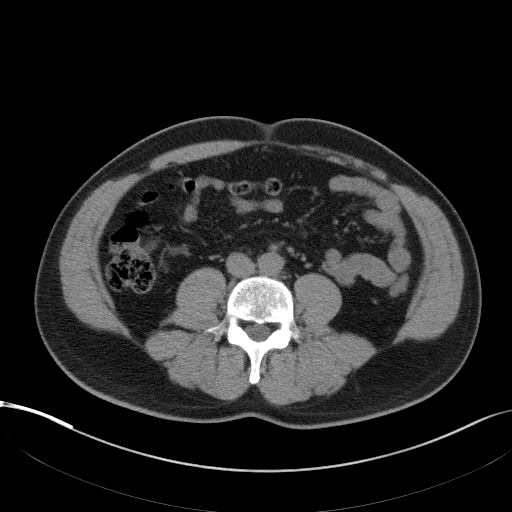
[im 55/95  soft-tissue]
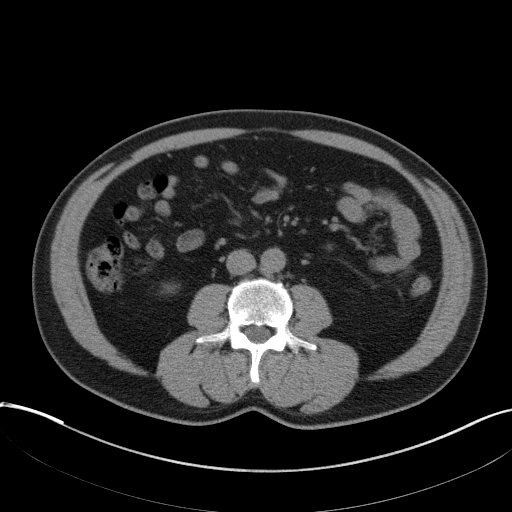
[im 62/95  soft-tissue]
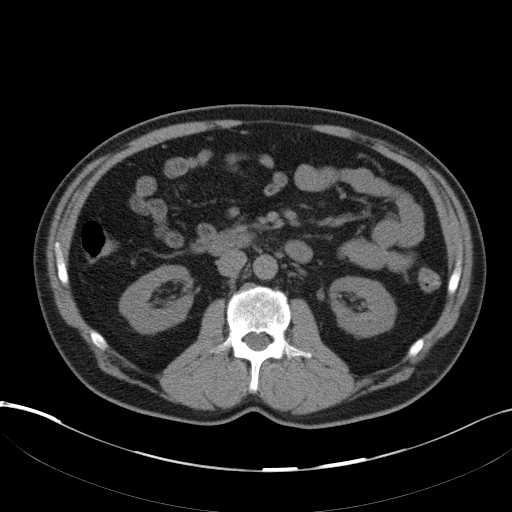
[im 62/95  bone]
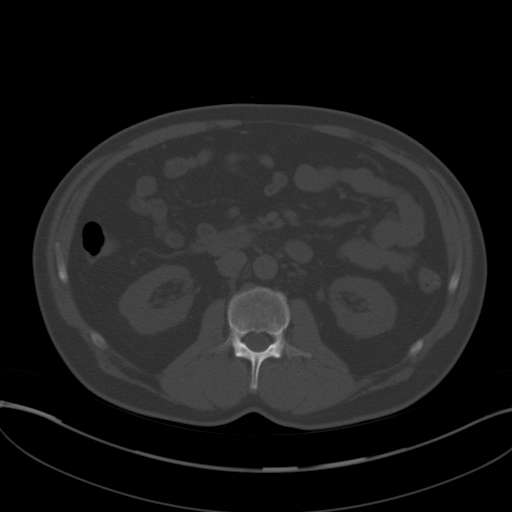
[im 69/95  soft-tissue]
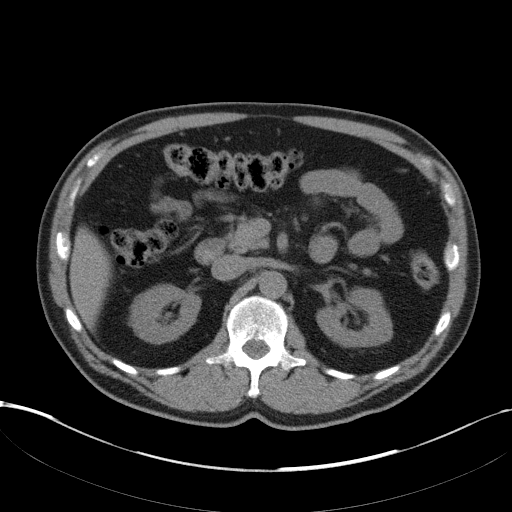
[im 76/95  soft-tissue]
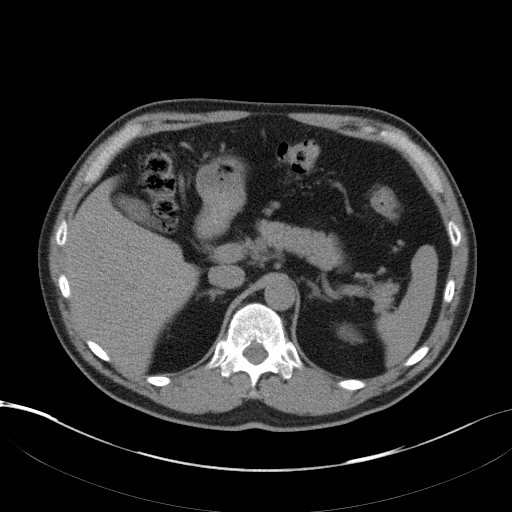
[im 84/95  soft-tissue]
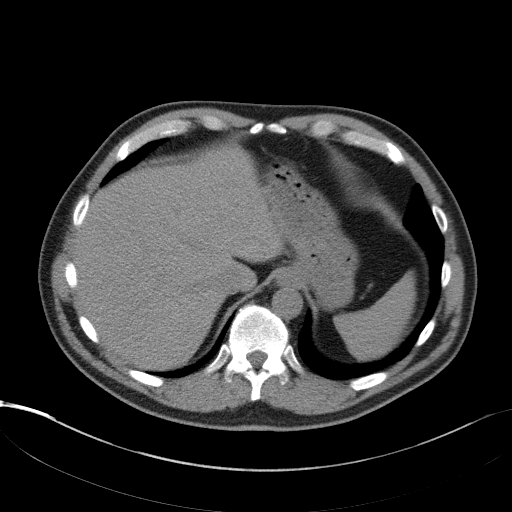
[im 91/95  soft-tissue]
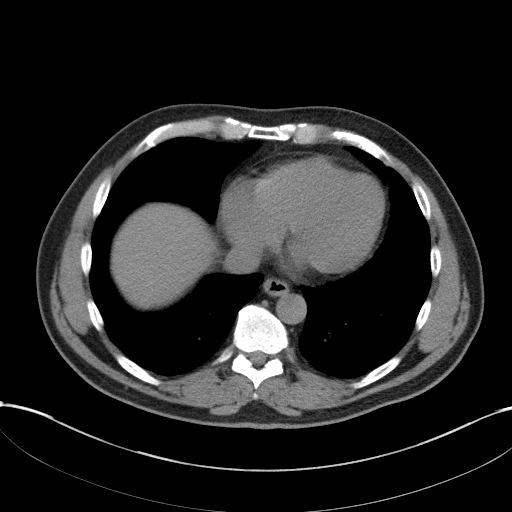

[Series 5: abd/pelvis 3.0 coronal · coronal · 0.82mm/px · 3 of 87 slices shown]
[im 29/87  soft-tissue]
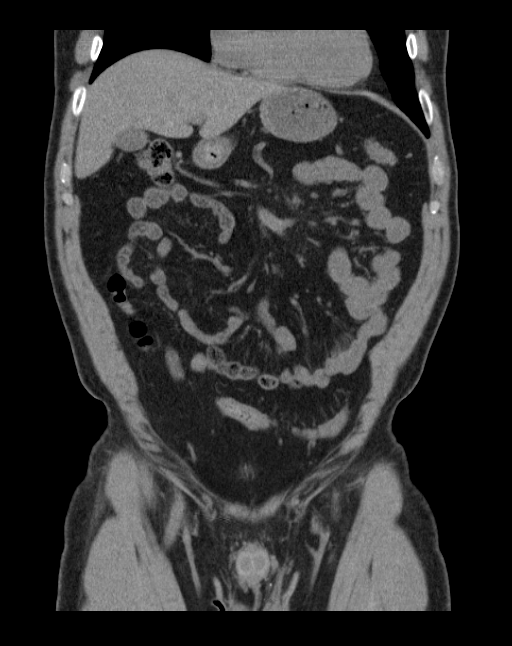
[im 39/87  soft-tissue]
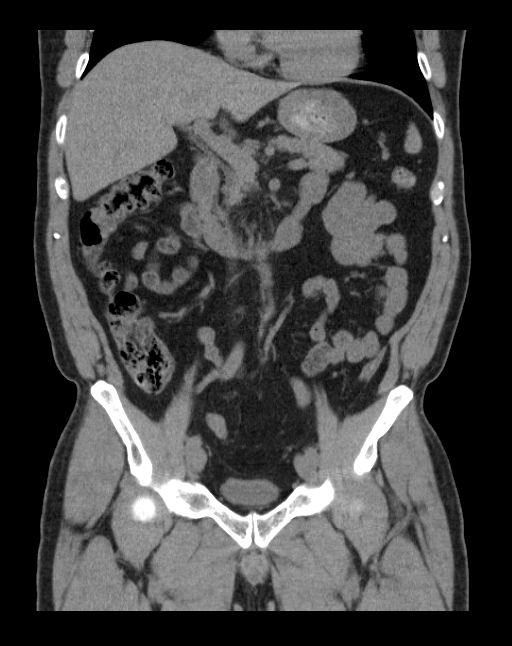
[im 48/87  soft-tissue]
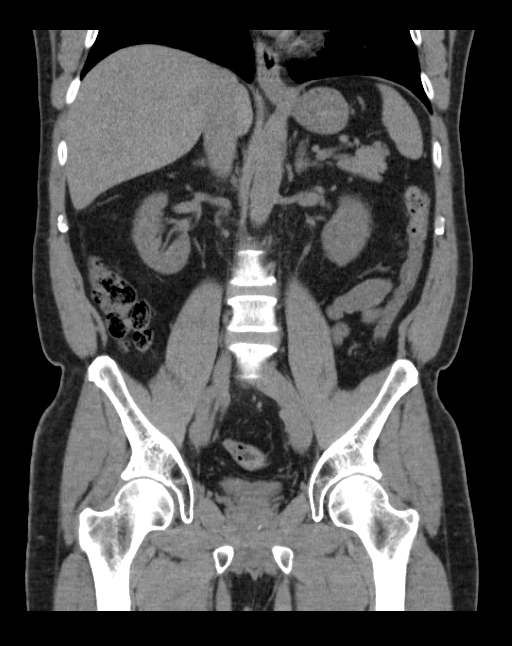

[16 of 46 positions shown; findings below may reference images not displayed]

FINDINGS: The kidneys are normal in contour. There is a large stone at the
ureteropelvic junction on the left which measures 9 x 5 mm. It is
not producing obstruction of the left kidney. No other stones on the
left are demonstrated and there are no kidney stones on the right.
Along the course of the ureters no stones are evident. The partially
distended urinary bladder is normal in appearance. The prostate
gland is normal in size and exhibits faint calcifications.

The gallbladder is adequately distended with no evidence of stones
or inflammatory change. The liver, partially distended stomach,
pancreas, spleen, and adrenal glands exhibit no acute abnormalities.
There are punctate calcifications within the spleen consistent with
previous granulomatous infection. The caliber of the abdominal aorta
is normal.

The unopacified loops of small and large bowel exhibit no evidence
of ileus nor obstruction. There is no evidence of colitis or acute
diverticulitis. There is no inguinal nor significant umbilical
hernia.

The lumbar vertebral bodies are preserved in height. There is mild
disc bulging noted at the L4-5 disc level. The bony pelvis is normal
in appearance. The lung bases are clear.
IMPRESSION: 1. There is a 5 x 9 mm diameter calcified stone at the ureteropelvic
junction on the left. This may be responsible for the patient's
hematuria. It is not producing significant obstruction of the left
kidney currently. No stones are demonstrated elsewhere on the left
nor on the right. There is no perinephric inflammatory change. The
urinary bladder and prostate gland exhibit no acute abnormalities.
2. There is no acute hepatobiliary nor acute bowel abnormality.
3. There is no intra-abdominal or pelvic lymphadenopathy nor
evidence of ascites.

## 2015-03-13 IMAGING — CR DG ABDOMEN 1V
2 series · 2 of 2 positions shown · non-contrast
Comparison: Prior CT from 10/06/2013.

CLINICAL DATA: Pre lithotripsy

EXAM:
ABDOMEN - 1 VIEW

[t abdomen supine (1 of 2)]
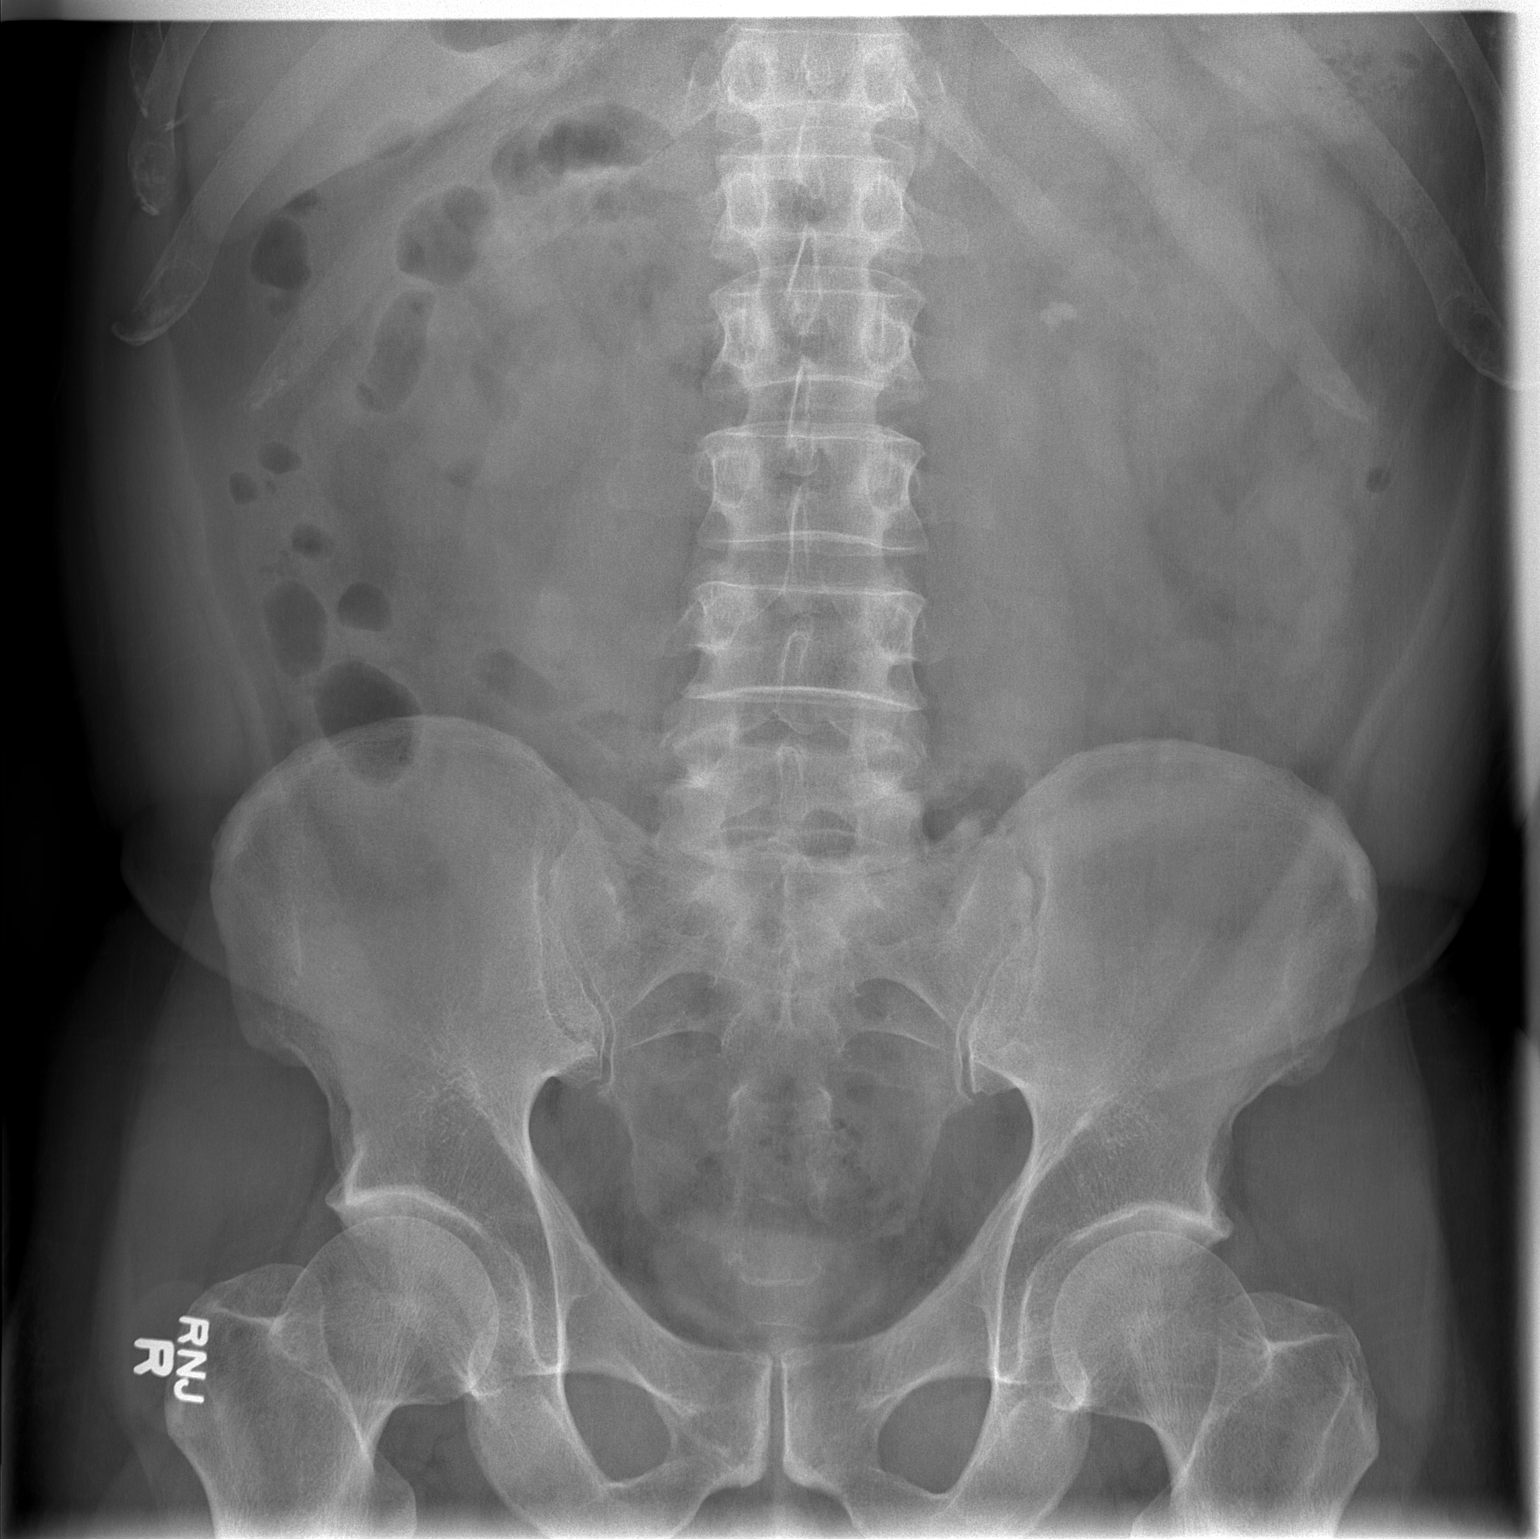

[t abdomen supine (2 of 2)]
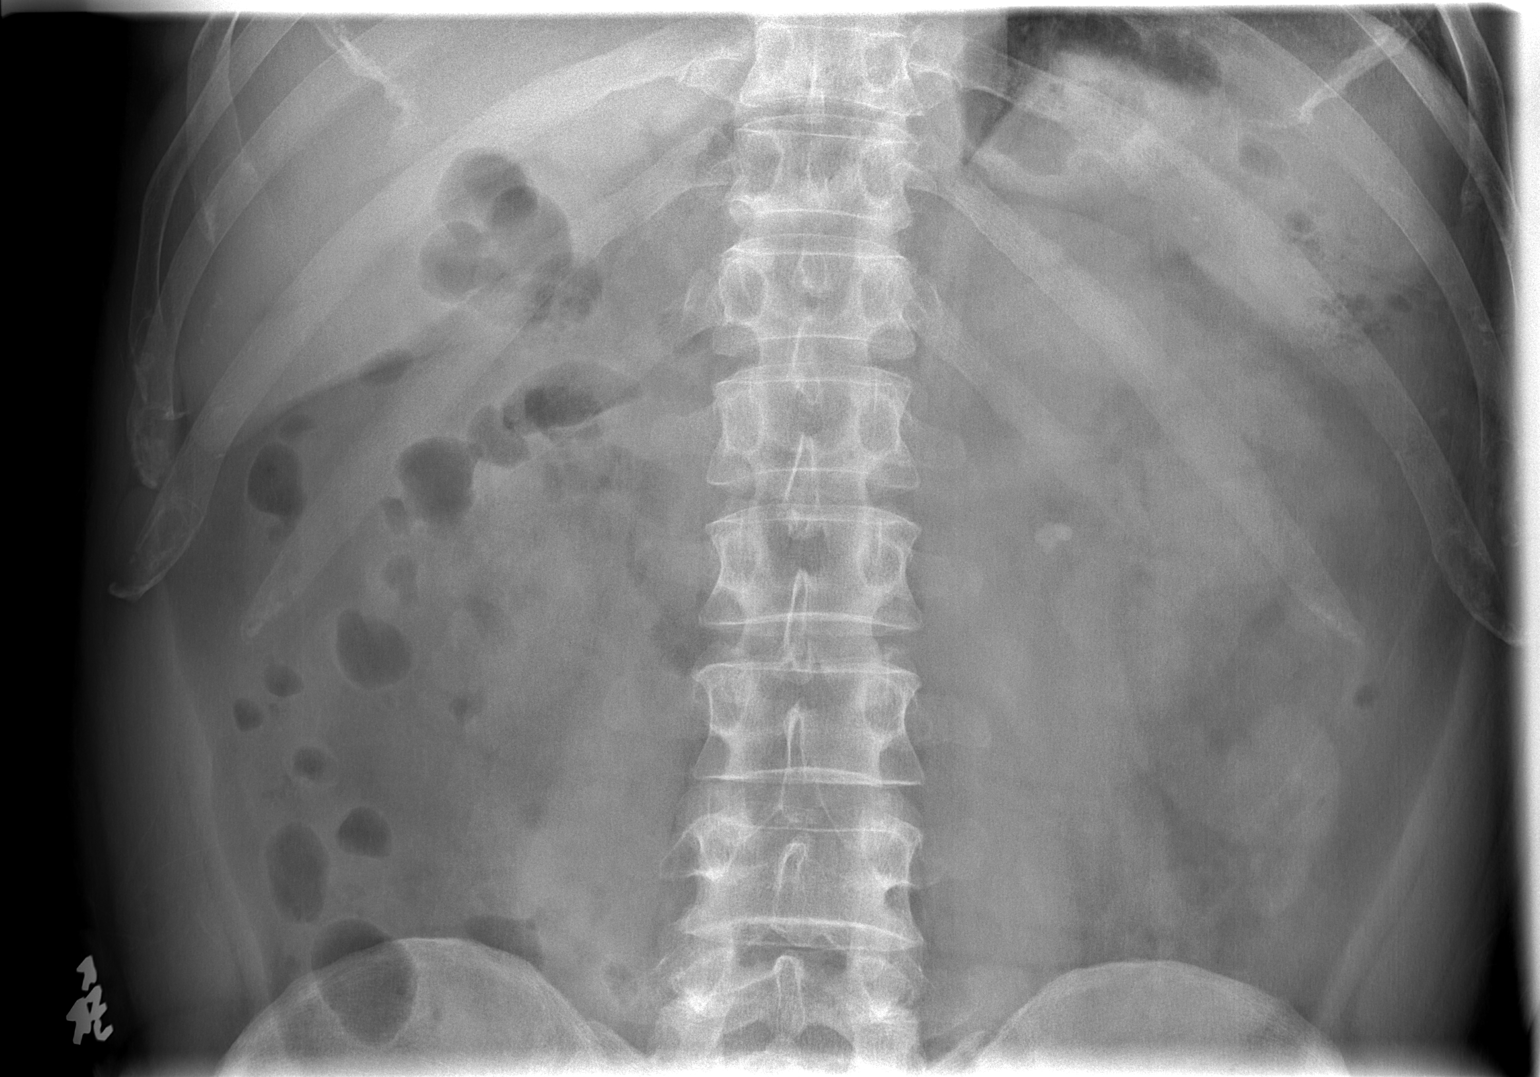

[2 of 2 positions shown; findings below may reference images not displayed]

FINDINGS: Visualized bowel gas pattern is nonobstructive. 9 mm calcific
density overlies the left renal hilum, compatible with previously
seen stone in this region. No other renal calculi identified. No
soft tissue mass.

No acute osseous abnormality.
IMPRESSION: 9 mm calcification overlying the region of the left ureteropelvic
junction, consistent with previously identified stone in this
region. No other renal calculi identified.

## 2016-11-03 ENCOUNTER — Ambulatory Visit: Payer: Managed Care, Other (non HMO) | Admitting: Internal Medicine

## 2016-11-03 ENCOUNTER — Encounter: Payer: Self-pay | Admitting: Internal Medicine

## 2016-11-03 ENCOUNTER — Ambulatory Visit (INDEPENDENT_AMBULATORY_CARE_PROVIDER_SITE_OTHER): Payer: Managed Care, Other (non HMO) | Admitting: Internal Medicine

## 2016-11-03 VITALS — BP 130/68 | HR 71 | Temp 97.6°F | Resp 14 | Ht 67.0 in | Wt 178.5 lb

## 2016-11-03 DIAGNOSIS — T169XXA Foreign body in ear, unspecified ear, initial encounter: Secondary | ICD-10-CM | POA: Diagnosis not present

## 2016-11-03 NOTE — Progress Notes (Signed)
Subjective:    Patient ID: Justin Murphy, male    DOB: [birth year], 66 y.o.   MRN: [id number]  DOS:  11/03/2016 Type of visit - description : Acute  Interval history: Went to his audiologist today, was told he had retained rubber tips from his  hearing aids in the ear.    Review of Systems Denies ear pain or discharge.   Past Medical History:  Diagnosis Date  . Allergy   . Hypercholesteremia   . Personal history of colonic polyps - adenomas 03/14/2014  . TMJ syndrome   . Urolithiasis    in the 80s    Past Surgical History:  Procedure Laterality Date  . APPENDECTOMY    . COLONOSCOPY  2007   diminutive left hyperplastic polyp  . VASECTOMY      Social History   Social History  . Marital status: Married    Spouse name: N/A  . Number of children: 4  . 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 Smoking status: Never Smoker  . Smokeless tobacco: Never Used  . Alcohol use Yes     Comment: very rare   . Drug use: No  . Sexual activity: Not on file   Other Topics Concern  . Not on file   Social History Narrative   Original from [place] moved to [place] after retirement ~ 2011   Married 12 years Stanton Kidney)   1 daughter, 3 sons             Allergies as of 11/03/2016   No Known Allergies     Medication List       Accurate as of 11/03/16  2:27 PM. Always use your most recent med list.          amoxicillin 875 MG tablet Commonly known as:  AMOXIL Take 1 tablet (875 mg total) by mouth 2 (two) times daily.   ciprofloxacin-dexamethasone otic suspension Commonly known as:  CIPRODEX Apply 4 drops to the affected ear(s) twice daily x 7 days   fexofenadine 180 MG tablet Commonly known as:  ALLEGRA Take 180 mg by mouth daily.   multivitamin with minerals Tabs tablet Take 1 tablet by mouth daily.   OMEGA 3 PO Take 1 tablet by mouth 2 (two) times a week.   PROBIOTIC DAILY PO Take 1 tablet  by mouth 2 (two) times a week.   traMADol 50 MG tablet Commonly known as:  ULTRAM Take 1 tablet (50 mg total) by mouth every 6 (six) hours as needed for severe pain.   valACYclovir 1000 MG tablet Commonly known as:  VALTREX Take 1 tablet (1,000 mg total) by mouth 3 (three) times daily.   valACYclovir 1000 MG tablet Commonly known as:  VALTREX Take 1 tablet (1,000 mg total) by mouth 3 (three) times daily.          Objective:   Physical Exam BP 130/68 (BP Location: Left Arm, Patient Position: Sitting, Cuff Size: Normal)   Pulse 71   Temp 97.6 F (36.4 C) (Oral)   Resp 14   Ht 5\' 7"  (1.702 m)   Wt 178 lb 8 oz (81 kg)   SpO2 97%   BMI 27.96 kg/m  General:   Well developed, well nourished . NAD.  HEENT:  Normocephalic . Face symmetric, atraumatic Otoscopy: + FB in both canals. Exam is otherwise normal. Skin: Not pale. Not jaundice Neurologic:  alert &  oriented X3.  Speech normal, gait appropriate for age and unassisted Psych--  Cognition and judgment appear intact.  Cooperative with normal attention span and concentration.  Behavior appropriate. No anxious or depressed appearing.      Assessment & Plan:    66 year old gentleman h/o : High cholesterol H/o Colon polyps H/o  urolithiasis Allergies HOH, hearing aids Shingles  Presents today with the following issue: FB, bilateral ear canals: With the alligator forceps I removed w/ the assistance of my nurse #2 rubber tips from the R and #5 rubber tips from the L  ear. After that, the TMs and canals look healthy  He has a small amount of wax. Patient to let me know if he has pain or discharge. Okay to use peroxide to clear his wax. Recommend to discuss with his audiologist the ongoing use of this particular hearing aid. > 15 min

## 2016-11-03 NOTE — Progress Notes (Signed)
Pre visit review using our clinic review tool, if applicable. No additional management support is needed unless otherwise documented below in the visit note. 

## 2016-11-04 DIAGNOSIS — Z09 Encounter for follow-up examination after completed treatment for conditions other than malignant neoplasm: Secondary | ICD-10-CM | POA: Insufficient documentation

## 2016-11-04 NOTE — Assessment & Plan Note (Signed)
FB, bilateral ear canals: With the alligator forceps I removed w/ the assistance of my nurse #2 rubber tips from the R and #5 rubber tips from the L  ear. After that, the TMs and canals look healthy  He has a small amount of wax. Patient to let me know if he has pain or discharge. Okay to use peroxide to clear his wax. Recommend to discuss with his audiologist the ongoing use of this particular hearing aid.

## 2016-11-06 ENCOUNTER — Encounter: Payer: Self-pay | Admitting: Internal Medicine

## 2016-11-06 ENCOUNTER — Ambulatory Visit (INDEPENDENT_AMBULATORY_CARE_PROVIDER_SITE_OTHER): Payer: Managed Care, Other (non HMO) | Admitting: Internal Medicine

## 2016-11-06 VITALS — BP 128/74 | HR 59 | Temp 98.1°F | Resp 14 | Ht 67.0 in | Wt 178.5 lb

## 2016-11-06 DIAGNOSIS — Z0000 Encounter for general adult medical examination without abnormal findings: Secondary | ICD-10-CM

## 2016-11-06 DIAGNOSIS — Z23 Encounter for immunization: Secondary | ICD-10-CM | POA: Diagnosis not present

## 2016-11-06 LAB — PSA: PSA: 0.67 ng/mL (ref 0.10–4.00)

## 2016-11-06 LAB — COMPREHENSIVE METABOLIC PANEL
ALBUMIN: 4.4 g/dL (ref 3.5–5.2)
ALK PHOS: 62 U/L (ref 39–117)
ALT: 26 U/L (ref 0–53)
AST: 21 U/L (ref 0–37)
BILIRUBIN TOTAL: 0.8 mg/dL (ref 0.2–1.2)
BUN: 22 mg/dL (ref 6–23)
CALCIUM: 9.6 mg/dL (ref 8.4–10.5)
CO2: 30 meq/L (ref 19–32)
Chloride: 106 mEq/L (ref 96–112)
Creatinine, Ser: 1.05 mg/dL (ref 0.40–1.50)
GFR: 75.52 mL/min (ref >60.00)
Glucose, Bld: 95 mg/dL (ref 70–99)
Potassium: 4.5 mEq/L (ref 3.5–5.1)
Sodium: 140 mEq/L (ref 135–145)
TOTAL PROTEIN: 6.5 g/dL (ref 6.0–8.3)

## 2016-11-06 LAB — CBC WITH DIFFERENTIAL/PLATELET
BASOS ABS: 0 10*3/uL (ref 0.0–0.1)
Basophils Relative: 0.7 % (ref 0.0–3.0)
Eosinophils Absolute: 0.1 10*3/uL (ref 0.0–0.7)
Eosinophils Relative: 3 % (ref 0.0–5.0)
HCT: 46.9 % (ref 39.0–52.0)
HEMOGLOBIN: 16.1 g/dL (ref 13.0–17.0)
LYMPHS PCT: 28.5 % (ref 12.0–46.0)
Lymphs Abs: 1.4 10*3/uL (ref 0.7–4.0)
MCHC: 34.4 g/dL (ref 30.0–36.0)
MCV: 96.5 fl (ref 78.0–100.0)
MONOS PCT: 9.5 % (ref 3.0–12.0)
Monocytes Absolute: 0.4 10*3/uL (ref 0.1–1.0)
Neutro Abs: 2.8 10*3/uL (ref 1.4–7.7)
Neutrophils Relative %: 58.3 % (ref 43.0–77.0)
Platelets: 142 10*3/uL — ABNORMAL LOW (ref 150.0–400.0)
RBC: 4.86 Mil/uL (ref 4.22–5.81)
RDW: 13 % (ref 11.5–15.5)
WBC: 4.8 10*3/uL (ref 4.0–10.5)

## 2016-11-06 LAB — LIPID PANEL
CHOL/HDL RATIO: 6
CHOLESTEROL: 211 mg/dL — AB (ref 0–200)
HDL: 36.2 mg/dL — AB (ref >39.00)
LDL CALC: 149 mg/dL — AB (ref 0–99)
NonHDL: 174.83
TRIGLYCERIDES: 130 mg/dL (ref 0.0–149.0)
VLDL: 26 mg/dL (ref 0.0–40.0)

## 2016-11-06 LAB — TSH: TSH: 2.59 u[IU]/mL (ref 0.35–4.50)

## 2016-11-06 LAB — HEPATITIS C ANTIBODY: HCV Ab: NEGATIVE

## 2016-11-06 LAB — HIV ANTIBODY (ROUTINE TESTING W REFLEX): HIV 1&2 Ab, 4th Generation: NONREACTIVE

## 2016-11-06 NOTE — Assessment & Plan Note (Addendum)
Tdap--today  Shingles shot discussed , waiting for shingrex + FH Colon Ca, h/o GI bleed   Had a cscope at age 66, no reports Cscope 02-2014, 3 polyps, 3 years  DRE normal, check a PSA Reports he is very active, take long walks daily, doing well with diet. Labs  : CMP, FLP, CBC, TSH, PSA, HIV, hep C

## 2016-11-06 NOTE — Patient Instructions (Signed)
GO TO THE LAB : Get the blood work     GO TO THE FRONT DESK Schedule your next appointment for a  physical exam in one year  

## 2016-11-06 NOTE — Progress Notes (Signed)
Pre visit review using our clinic review tool, if applicable. No additional management support is needed unless otherwise documented below in the visit note. 

## 2016-11-06 NOTE — Assessment & Plan Note (Signed)
Here for a physical exam, doing well, exam normal, check labs. Concerned about his allergies, recommend Flonase year-round and Allegra as needed, plans to see an allergist. RTC one year

## 2016-11-06 NOTE — Progress Notes (Signed)
Subjective:    Patient ID: Justin Murphy, male    DOB: April 19, 1951, 66 y.o.   MRN: [id number]  DOS:  11/06/2016 Type of visit - description : CPX Interval history: No major concerns   Review of Systems Constitutional: No fever. No chills. No unexplained wt changes. No unusual sweats  HEENT: No dental problems, no ear discharge, no facial swelling, no voice changes. No eye discharge, no eye  redness , no  intolerance to light   Respiratory: No wheezing , no  difficulty breathing. No cough , no mucus production  Cardiovascular: No CP, no leg swelling , no  Palpitations  GI: no nausea, no vomiting, no diarrhea , no  abdominal pain.  No blood in the stools. No dysphagia, no odynophagia    Endocrine: No polyphagia, no polyuria , no polydipsia  GU: No dysuria, gross hematuria, difficulty urinating. No urinary urgency, no frequency.  Musculoskeletal: No joint swellings or unusual aches or pains  Skin: No change in the color of the skin, palor , no  Rash  Allergic, immunologic: + Allergies, symptoms present most of the year, on Flonase and Allegra as needed  Neurological: No dizziness no  syncope. No headaches. No diplopia, no slurred, no slurred speech, no motor deficits, no facial  Numbness  Hematological: No enlarged lymph nodes, no easy bruising , no unusual bleedings  Psychiatry: No suicidal ideas, no hallucinations, no beavior problems, no confusion.  No unusual/severe anxiety, no depression   Past Medical History:  Diagnosis Date  . Allergy   . Hypercholesteremia   . Personal history of colonic polyps - adenomas 03/14/2014  . TMJ syndrome   . Urolithiasis    in the 80s    Past Surgical History:  Procedure Laterality Date  . APPENDECTOMY    . COLONOSCOPY  2007   diminutive left hyperplastic polyp  . VASECTOMY      Social History   Social History  . Marital status: Married    Spouse name: N/A  . Number of children: 4  . 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 Smoking status: Never Smoker  . Smokeless tobacco: Never Used  . Alcohol use Yes     Comment: very rare   . Drug use: No  . Sexual activity: Not on file   Other Topics Concern  . Not on file   Social History Narrative   Original from [place] moved to [place] after retirement ~ 2011   Married 22 years Stanton Kidney)   1 daughter, 3 sons            Family History  Problem Relation Age of Onset  . Alzheimer's disease Mother   . Colon cancer Father     dx early 23s  . Colon polyps Father   . Diabetes Other     uncle  . Prostate cancer Neg Hx   . Heart disease Neg Hx      Allergies as of 11/06/2016   No Known Allergies     Medication List       Accurate as of 11/06/16  6:59 PM. Always use your most recent med list.          fexofenadine 180 MG tablet Commonly known as:  ALLEGRA Take 180 mg by mouth daily.   fluticasone 50 MCG/ACT nasal spray Commonly known as:  FLONASE Place 2 sprays into both nostrils daily as needed for allergies  or rhinitis.   multivitamin with minerals Tabs tablet Take 1 tablet by mouth daily.   OMEGA 3 PO Take 1 tablet by mouth 2 (two) times a week.   PROBIOTIC DAILY PO Take 1 tablet by mouth 2 (two) times a week.          Objective:   Physical Exam BP 128/74 (BP Location: Left Arm, Patient Position: Sitting, Cuff Size: Normal)   Pulse (!) 59   Temp 98.1 F (36.7 C) (Oral)   Resp 14   Ht 5\' 7"  (1.702 m)   Wt 178 lb 8 oz (81 kg)   SpO2 98%   BMI 27.96 kg/m   General:   Well developed, well nourished . NAD.  Neck: No  thyromegaly  HEENT:  Normocephalic . Face symmetric, atraumatic Lungs:  CTA B Normal respiratory effort, no intercostal retractions, no accessory muscle use. Heart: RRR,  no murmur.  No pretibial edema bilaterally  Abdomen:  Not distended, soft, non-tender. No rebound or rigidity.   Skin: Exposed areas without rash. Not pale.  Not jaundice Rectal:  External abnormalities: none. Normal sphincter tone. No rectal masses or tenderness.  Stool brown  Prostate: Prostate gland firm and smooth, no enlargement, nodularity, tenderness, mass, asymmetry or induration.  Neurologic:  alert & oriented X3.  Speech normal, gait appropriate for age and unassisted Strength symmetric and appropriate for age.  Psych: Cognition and judgment appear intact.  Cooperative with normal attention span and concentration.  Behavior appropriate. No anxious or depressed appearing.    Assessment & Plan:   Assessment : High cholesterol Perennial allergies  H/o GI Bleed and  Colon polyps H/o  urolithiasis H/o shingles  HOH, hearing aids  PLAN: Here for a physical exam, doing well, exam normal, check labs. Concerned about his allergies, recommend Flonase year-round and Allegra as needed, plans to see an allergist. RTC one year

## 2017-02-24 ENCOUNTER — Encounter: Payer: Self-pay | Admitting: Internal Medicine

## 2017-02-24 ENCOUNTER — Ambulatory Visit (INDEPENDENT_AMBULATORY_CARE_PROVIDER_SITE_OTHER): Payer: Managed Care, Other (non HMO) | Admitting: Internal Medicine

## 2017-02-24 VITALS — BP 124/68 | HR 80 | Temp 97.6°F | Resp 14 | Ht 67.0 in | Wt 182.5 lb

## 2017-02-24 DIAGNOSIS — T169XXD Foreign body in ear, unspecified ear, subsequent encounter: Secondary | ICD-10-CM | POA: Diagnosis not present

## 2017-02-24 NOTE — Progress Notes (Signed)
Pre visit review using our clinic review tool, if applicable. No additional management support is needed unless otherwise documented below in the visit note. 

## 2017-02-24 NOTE — Progress Notes (Signed)
Subjective:    Patient ID: Justin Murphy, male    DOB: 1951/08/23, 66 y.o.   MRN: [id number]  DOS:  02/24/2017 Type of visit - description : acute Interval history: Feels like has a ear pice from his hearing aid  @ L ear x few days  Denies any pain or discharge.  Review of Systems   Past Medical History:  Diagnosis Date  . Allergy   . Hypercholesteremia   . Personal history of colonic polyps - adenomas 03/14/2014  . TMJ syndrome   . Urolithiasis    in the 80s    Past Surgical History:  Procedure Laterality Date  . APPENDECTOMY    . COLONOSCOPY  2007   diminutive left hyperplastic polyp  . VASECTOMY      Social History   Social History  . Marital status: Married    Spouse name: N/A  . Number of children: 4  . 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 Smoking status: Never Smoker  . Smokeless tobacco: Never Used  . Alcohol use Yes     Comment: very rare   . Drug use: No  . Sexual activity: Not on file   Other Topics Concern  . Not on file   Social History Narrative   Original from [place] moved to [place] after retirement ~ 2011   Married 57 years Stanton Kidney)   1 daughter, 3 sons             Allergies as of 02/24/2017   No Known Allergies     Medication List       Accurate as of 02/24/17  2:17 PM. Always use your most recent med list.          fexofenadine 180 MG tablet Commonly known as:  ALLEGRA Take 180 mg by mouth daily.   fluticasone 50 MCG/ACT nasal spray Commonly known as:  FLONASE Place 2 sprays into both nostrils daily as needed for allergies or rhinitis.   multivitamin with minerals Tabs tablet Take 1 tablet by mouth daily.   OMEGA 3 PO Take 1 tablet by mouth 2 (two) times a week.   PROBIOTIC DAILY PO Take 1 tablet by mouth 2 (two) times a week.          Objective:   Physical Exam BP 124/68 (BP Location: Left Arm, Patient Position: Sitting, Cuff Size:  Small)   Pulse 80   Temp 97.6 F (36.4 C) (Oral)   Resp 14   Ht 5\' 7"  (1.702 m)   Wt 182 lb 8 oz (82.8 kg)   SpO2 98%   BMI 28.58 kg/m  General:   Well developed, well nourished . NAD.  HEENT:  Normocephalic . Face symmetric, atraumatic Right ear: Normal Left ear: + Wax and FB Neurologic:  alert & oriented X3.  Speech normal, gait appropriate for age and unassisted Psych--  Cognition and judgment appear intact.  Cooperative with normal attention span and concentration.  Behavior appropriate. No anxious or depressed appearing.      Assessment & Plan:    Assessment : High cholesterol Perennial allergies  H/o GI Bleed and  Colon polyps H/o  urolithiasis H/o shingles  HOH, hearing aids  PLAN: FB L ear: a small soft plastic hearing aid piece removed with the alligator, wax removed with a spoon. Small amount of bleeding from the otherwise normal canal and TM was noted, will call if  pain, swelling

## 2017-02-25 NOTE — Assessment & Plan Note (Signed)
FB L ear: a small soft plastic hearing aid piece removed with the alligator, wax removed with a spoon. Small amount of bleeding from the otherwise normal canal and TM was noted, will call if pain, swelling

## 2017-04-02 ENCOUNTER — Encounter: Payer: Self-pay | Admitting: Internal Medicine

## 2017-09-14 ENCOUNTER — Ambulatory Visit: Payer: Self-pay | Admitting: *Deleted

## 2017-09-14 NOTE — Telephone Encounter (Signed)
Called in c/o a one time episode of diarrhea during the night that consisted of chills, abd pain.   It was more pasty and loose than diarrhea.   The only reason I called was because I had a dental implant inserted last week and had to take Amoxicillin for 3 days.  I finished it on Sat.   I read where diarrhea was a side effect of the Amoxicillin.  Since it was a one time occurrence and he feels fine this morning I went over the home care advice with him.   He verbalized understanding and I went over the s/s to look for and to call us back for.   Reason for Disposition . MILD-MODERATE diarrhea (e.g., 1-6 times / day more than normal)  Answer Assessment - Initial Assessment Questions 1. DIARRHEA SEVERITY: "How bad is the diarrhea?" "How many extra stools have you had in the past 24 hours than normal?"    - MILD: Few loose or mushy BMs; increase of 1-3 stools over normal daily number of stools; mild increase in ostomy output.   - MODERATE: Increase of 4-6 stools daily over normal; moderate increase in ostomy output.   - SEVERE (or Worst Possible): Increase of 7 or more stools daily over normal; moderate increase in ostomy output; incontinence.     Diarrhea just last night.  I felt gas, rolling in my stomach, had chills, diarrhea.   I had 15-30 minutes of pasty, loose stool.   2. ONSET: "When did the diarrhea begin?"      During the night.   I had abd pain and sweats. 3. BM CONSISTENCY: "How loose or watery is the diarrhea?"      Pasty and loose but not watery. 4. VOMITING: "Are you also vomiting?" If so, ask: "How many times in the past 24 hours?"      I felt nauseas but no vomiting.   I'm fine this morning 5. ABDOMINAL PAIN: "Are you having any abdominal pain?" If yes: "What does it feel like?" (e.g., crampy, dull, intermittent, constant)      No pain now but did during the night 6. ABDOMINAL PAIN SEVERITY: If present, ask: "How bad is the pain?"  (e.g., Scale 1-10; mild, moderate, or severe)    -  MILD (1-3): doesn't interfere with normal activities, abdomen soft and not tender to touch     - MODERATE (4-7): interferes with normal activities or awakens from sleep, tender to touch     - SEVERE (8-10): excruciating pain, doubled over, unable to do any normal activities       None now. 7. ORAL INTAKE: If vomiting, "Have you been able to drink liquids?" "How much fluids have you had in the past 24 hours?"     I've not eaten anything this morning because I slept in until 10:00am.   I would not have called since it only happened that one time but I saw where diarrhea could be a side effect of the Amoxicillin.   I finished it on Sat.   Just had to take it 3 days for a dental implant I had done last week. 8. HYDRATION: "Any signs of dehydration?" (e.g., dry mouth [not just dry lips], too weak to stand, dizziness, new weight loss) "When did you last urinate?"     Denies dizziness, weakness. 9. EXPOSURE: "Have you traveled to a foreign country recently?" "Have you been exposed to anyone with diarrhea?" "Could you have eaten any food that was spoiled?"  I haven't eaten anything out of the ordinary. 10. OTHER SYMPTOMS: "Do you have any other symptoms?" (e.g., fever, blood in stool)       No 11. PREGNANCY: "Is there any chance you are pregnant?" "When was your last menstrual period?"       *No Answer*  Protocols used: DIARRHEA-A-AH

## 2017-11-06 ENCOUNTER — Encounter: Payer: Self-pay | Admitting: Internal Medicine

## 2017-11-06 ENCOUNTER — Ambulatory Visit (INDEPENDENT_AMBULATORY_CARE_PROVIDER_SITE_OTHER): Payer: Medicare Other | Admitting: Internal Medicine

## 2017-11-06 VITALS — BP 116/68 | HR 87 | Temp 98.2°F | Resp 14 | Ht 67.0 in | Wt 180.2 lb

## 2017-11-06 DIAGNOSIS — R519 Headache, unspecified: Secondary | ICD-10-CM

## 2017-11-06 DIAGNOSIS — R51 Headache: Secondary | ICD-10-CM

## 2017-11-06 NOTE — Patient Instructions (Signed)
Schedule a physical in the next 2-3 months , fasting

## 2017-11-06 NOTE — Progress Notes (Signed)
Pre visit review using our clinic review tool, if applicable. No additional management support is needed unless otherwise documented below in the visit note. 

## 2017-11-06 NOTE — Progress Notes (Signed)
Subjective:    Patient ID: Justin Murphy, male    DOB: 07-19-1951, 67 y.o.   MRN: [id number]  DOS:  11/06/2017 Type of visit - description : acute Interval history: Chief complaint is pain at the left cheek and behind the eye started 2.5 weeks ago. His pain is on and off, typically wake him up at 3 or 4 in the morning and last few hours, not every morning. Occasionally has another episode throughout the day. Has similar symptoms 3 or 4 years ago, he started to use mouthguard and it helped   He had a URI 3 weeks ago he is much better. He is also undergoing major dental work including crowns, last surgery yesterday.  His old  mouthguard does not seem to fit as well as it did before they started this round of dental work.  Also, his audiologist noted some wax on the left, he is using a OTC wax softener.  Review of Systems  No associated nausea with facial pain Denies fever chills Denies visual disturbances, watery eye, light intolerance or red eye. As far as the URI, he had fever at the beginning but not anymore. Very little nasal discharge. No cough or chest congestion.   Past Medical History:  Diagnosis Date  . Allergy   . Hypercholesteremia   . Personal history of colonic polyps - adenomas 03/14/2014  . TMJ syndrome   . Urolithiasis    in the 80s    Past Surgical History:  Procedure Laterality Date  . APPENDECTOMY    . COLONOSCOPY  2007   diminutive left hyperplastic polyp  . VASECTOMY      Social History   Socioeconomic History  . Marital status: Married    Spouse name: Not on file  . Number of children: 4  . Years of education: Not on file  . Highest education level: Not on file  Social Needs  . Financial resource strain: Not on file  . Food insecurity - worry: Not on file  . Food insecurity - inability: Not on file  . Transportation needs - medical: Not on file  . Transportation needs - non-medical: Not on file  Occupational History  . Occupation:  Surveyor, minerals: [employer]  Tobacco Use  . Smoking status: Never Smoker  . Smokeless tobacco: Never Used  Substance and Sexual Activity  . Alcohol use: Yes    Comment: very rare   . Drug use: No  . Sexual activity: Not on file  Other Topics Concern  . Not on file  Social History Narrative   Original from [place] moved to [place] after retirement ~ 2011   Married 27 years Stanton Kidney)   1 daughter, 3 sons             Allergies as of 11/06/2017   No Known Allergies     Medication List        Accurate as of 11/06/17  2:24 PM. Always use your most recent med list.          fexofenadine 180 MG tablet Commonly known as:  ALLEGRA Take 180 mg by mouth daily.   fluticasone 50 MCG/ACT nasal spray Commonly known as:  FLONASE Place 2 sprays into both nostrils daily as needed for allergies or rhinitis.   multivitamin with minerals Tabs tablet Take 1 tablet by mouth daily.   OMEGA 3 PO Take 1 tablet by mouth 2 (two) times a week.   PROBIOTIC DAILY PO Take 1  tablet by mouth 2 (two) times a week.          Objective:   Physical Exam BP 116/68 (BP Location: Left Arm, Patient Position: Sitting, Cuff Size: Small)   Pulse 87   Temp 98.2 F (36.8 C) (Oral)   Resp 14   Ht 5\' 7"  (1.702 m)   Wt 180 lb 4 oz (81.8 kg)   SpO2 97%   BMI 28.23 kg/m  General:   Well developed, well nourished . NAD.  HEENT:  Normocephalic . Face symmetric, atraumatic.  EOMI, pupils equal and reactive, no redness, no photophobia. TMJ: no click or TTP. Throat: Symmetric Gum:  Examined,  no abscess or obvious infection. Ears: Minimal wax on the right, soft, yellow wax on the left. Lungs:  CTA B Normal respiratory effort, no intercostal retractions, no accessory muscle use. Heart: RRR,  no murmur.  No pretibial edema bilaterally  Skin: Not pale. Not jaundice Neurologic:  alert & oriented X3.  Speech normal, gait appropriate for age and unassisted Psych--  Cognition and judgment appear  intact.  Cooperative with normal attention span and concentration.  Behavior appropriate. No anxious or depressed appearing.      Assessment & Plan:   Assessment : High cholesterol Perennial allergies  H/o GI Bleed and  Colon polyps H/o  urolithiasis H/o shingles  HOH, hearing aids  PLAN: Facial pain: Doubt acute bacterial sinusitis, migraine, likely related to recent dental work and mouth guard not fitting as well as before.  Maybe his occlusion is temporarily off while the dentist is doing his crowns.  Rec to call me if he has severe sxs or sx c/w sinusitis.  Otherwise recommend to discuss with his dentist and cont w/ mouthguard use Cerumen impaction: I removed soft, yellow cerumen from the left ear w/ a spoon, moderate amount, TM normal, he did have a scratch after I finished with minimal bleeding. Pt aware Due for a CPX, patient to schedule

## 2017-11-07 NOTE — Assessment & Plan Note (Signed)
Facial pain: Doubt acute bacterial sinusitis, migraine, likely related to recent dental work and mouth guard not fitting as well as before.  Maybe his occlusion is temporarily off while the dentist is doing his crowns.  Rec to call me if he has severe sxs or sx c/w sinusitis.  Otherwise recommend to discuss with his dentist and cont w/ mouthguard use Cerumen impaction: I removed soft, yellow cerumen from the left ear w/ a spoon, moderate amount, TM normal, he did have a scratch after I finished with minimal bleeding. Pt aware Due for a CPX, patient to schedule

## 2018-02-01 DIAGNOSIS — H52203 Unspecified astigmatism, bilateral: Secondary | ICD-10-CM | POA: Diagnosis not present

## 2018-02-01 DIAGNOSIS — H524 Presbyopia: Secondary | ICD-10-CM | POA: Diagnosis not present

## 2018-02-01 DIAGNOSIS — H5213 Myopia, bilateral: Secondary | ICD-10-CM | POA: Diagnosis not present

## 2018-02-01 DIAGNOSIS — H2513 Age-related nuclear cataract, bilateral: Secondary | ICD-10-CM | POA: Diagnosis not present

## 2018-02-19 ENCOUNTER — Ambulatory Visit (INDEPENDENT_AMBULATORY_CARE_PROVIDER_SITE_OTHER): Payer: Medicare Other | Admitting: Internal Medicine

## 2018-02-19 ENCOUNTER — Encounter: Payer: Self-pay | Admitting: Internal Medicine

## 2018-02-19 VITALS — BP 136/88 | HR 55 | Temp 97.3°F | Resp 16 | Ht 67.0 in | Wt 174.0 lb

## 2018-02-19 DIAGNOSIS — Z0000 Encounter for general adult medical examination without abnormal findings: Secondary | ICD-10-CM

## 2018-02-19 DIAGNOSIS — K635 Polyp of colon: Secondary | ICD-10-CM | POA: Diagnosis not present

## 2018-02-19 LAB — LIPID PANEL
Cholesterol: 222 mg/dL — ABNORMAL HIGH (ref 0–200)
HDL: 38.1 mg/dL — ABNORMAL LOW
LDL Cholesterol: 158 mg/dL — ABNORMAL HIGH (ref 0–99)
NonHDL: 183.73
Total CHOL/HDL Ratio: 6
Triglycerides: 128 mg/dL (ref 0.0–149.0)
VLDL: 25.6 mg/dL (ref 0.0–40.0)

## 2018-02-19 LAB — BASIC METABOLIC PANEL
BUN: 24 mg/dL — ABNORMAL HIGH (ref 6–23)
CALCIUM: 9.9 mg/dL (ref 8.4–10.5)
CO2: 29 mEq/L (ref 19–32)
Chloride: 103 mEq/L (ref 96–112)
Creatinine, Ser: 1.09 mg/dL (ref 0.40–1.50)
GFR: 72.04 mL/min (ref >60.00)
Glucose, Bld: 97 mg/dL (ref 70–99)
Potassium: 5.4 mEq/L — ABNORMAL HIGH (ref 3.5–5.1)
SODIUM: 139 meq/L (ref 135–145)

## 2018-02-19 LAB — CBC
HEMATOCRIT: 47.1 % (ref 39.0–52.0)
HEMOGLOBIN: 16.5 g/dL (ref 13.0–17.0)
MCHC: 35 g/dL (ref 30.0–36.0)
MCV: 97.5 fl (ref 78.0–100.0)
Platelets: 147 10*3/uL — ABNORMAL LOW (ref 150.0–400.0)
RBC: 4.83 Mil/uL (ref 4.22–5.81)
RDW: 13 % (ref 11.5–15.5)
WBC: 5 10*3/uL (ref 4.0–10.5)

## 2018-02-19 NOTE — Assessment & Plan Note (Addendum)
-  Tdap 2018 ;  shingrex not available; declined PNM shots + FH Colon Ca, h/o GI bleed   Had a cscope at age 67, no reports Cscope 02-2014, 3 polyps, 3 years, refer to GI - prostate ca screening: DRE and PSA wnl 10/2016 -Labs reviewed, due for a BMP, FLP and CBC. - Has a excellent lifestyle, walks 5 miles daily, watching his diet, has lost some weight per his own scales. - PHQ 9: zero

## 2018-02-19 NOTE — Progress Notes (Signed)
Subjective:    Patient ID: Justin Murphy, male    DOB: 14-Oct-1950, 67 y.o.   MRN: [id number]  DOS:  02/19/2018 Type of visit - description : cpx Interval history: Since the last office visit he is doing well. He is very active daily, has improve his diet.  Trying to lose weight.   Review of Systems  A 14 point review of systems is negative    Past Medical History:  Diagnosis Date  . Allergy   . Hypercholesteremia   . Personal history of colonic polyps - adenomas 03/14/2014  . TMJ syndrome   . Urolithiasis    in the 80s    Past Surgical History:  Procedure Laterality Date  . APPENDECTOMY    . COLONOSCOPY  2007   diminutive left hyperplastic polyp  . VASECTOMY      Social History   Socioeconomic History  . Marital status: Married    Spouse name: Not on file  . Number of children: 4  . Years of education: Not on file  . Highest education level: Not on file  Occupational History  . Occupation: Surveyor, minerals: [employer]  . Financial resource strain: Not on file  . Food insecurity:    Worry: Not on file    Inability: Not on file  . Transportation needs:    Medical: Not on file    Non-medical: Not on file  Tobacco Use  . Smoking status: Never Smoker  . Smokeless tobacco: Never Used  Substance and Sexual Activity  . Alcohol use: Yes    Comment: very rare   . Drug use: No  . Sexual activity: Not on file  Lifestyle  . Physical activity:    Days per week: Not on file    Minutes per session: Not on file  . Stress: Not on file  Relationships  . Social connections:    Talks on phone: Not on file    Gets together: Not on file    Attends religious service: Not on file    Active member of club or organization: Not on file    Attends meetings of clubs or organizations: Not on file    Relationship status: Not on file  . Intimate partner violence:    Fear of current or ex partner: Not on file    Emotionally abused: Not on file   Physically abused: Not on file    Forced sexual activity: Not on file  Other Topics Concern  . Not on file  Social History Narrative   Original from [place] moved to [place] after retirement ~ 2011   Married 35 years Stanton Kidney)   1 daughter, 3 sons  ( no children in [place])          Family History  Problem Relation Age of Onset  . Alzheimer's disease Mother   . Colon cancer Father        dx early 73s  . Colon polyps Father   . Diabetes Other        uncle  . Prostate cancer Neg Hx   . Heart disease Neg Hx      Allergies as of 02/19/2018   No Known Allergies     Medication List        Accurate as of 02/19/18 11:59 PM. Always use your most recent med list.          fexofenadine 180 MG tablet Commonly known as:  ALLEGRA Take 180  mg by mouth daily.   fluticasone 50 MCG/ACT nasal spray Commonly known as:  FLONASE Place 2 sprays into both nostrils daily as needed for allergies or rhinitis.   multivitamin with minerals Tabs tablet Take 1 tablet by mouth daily.   OMEGA 3 PO Take 1 tablet by mouth 2 (two) times a week.   PROBIOTIC DAILY PO Take 1 tablet by mouth 2 (two) times a week.          Objective:   Physical Exam BP 136/88 (BP Location: Right Arm, Patient Position: Sitting, Cuff Size: Small)   Pulse (!) 55   Temp (!) 97.3 F (36.3 C) (Oral)   Resp 16   Ht 5\' 7"  (1.702 m)   Wt 174 lb (78.9 kg)   SpO2 96%   BMI 27.25 kg/m  General:   Well developed, well nourished . NAD.  Neck: No  thyromegaly  HEENT:  Normocephalic . Face symmetric, atraumatic Lungs:  CTA B Normal respiratory effort, no intercostal retractions, no accessory muscle use. Heart: RRR,  no murmur.  No pretibial edema bilaterally  Abdomen:  Not distended, soft, non-tender. No rebound or rigidity.   Skin: Exposed areas without rash. Not pale. Not jaundice Neurologic:  alert & oriented X3.  Speech normal, gait appropriate for age and unassisted Strength symmetric and appropriate for age.    Psych: Cognition and judgment appear intact.  Cooperative with normal attention span and concentration.  Behavior appropriate. No anxious or depressed appearing.     Assessment & Plan:   Assessment : High cholesterol Perennial allergies  H/o GI Bleed and  Colon polyps H/o  urolithiasis H/o shingles  HOH, hearing aids  PLAN: Hyperlipidemia: Diet controlled, doing great with lifestyle, checking labs. Denies any other problems RTC 1 year, CPX

## 2018-02-19 NOTE — Patient Instructions (Signed)
GO TO THE LAB : Get the blood work     GO TO THE FRONT DESK Schedule your next appointment   for a physical exam in 1 year 

## 2018-02-21 NOTE — Assessment & Plan Note (Signed)
PLAN: Hyperlipidemia: Diet controlled, doing great with lifestyle, checking labs. Denies any other problems RTC 1 year, CPX

## 2018-02-22 ENCOUNTER — Other Ambulatory Visit: Payer: Self-pay

## 2018-02-22 DIAGNOSIS — E875 Hyperkalemia: Secondary | ICD-10-CM

## 2018-03-25 ENCOUNTER — Encounter: Payer: Self-pay | Admitting: Internal Medicine

## 2018-06-09 ENCOUNTER — Encounter: Payer: Self-pay | Admitting: Internal Medicine

## 2018-06-09 ENCOUNTER — Ambulatory Visit (INDEPENDENT_AMBULATORY_CARE_PROVIDER_SITE_OTHER): Payer: Medicare Other | Admitting: Internal Medicine

## 2018-06-09 VITALS — BP 132/90 | HR 76 | Temp 98.0°F | Resp 16 | Ht 67.0 in | Wt 180.1 lb

## 2018-06-09 DIAGNOSIS — J019 Acute sinusitis, unspecified: Secondary | ICD-10-CM | POA: Diagnosis not present

## 2018-06-09 DIAGNOSIS — E875 Hyperkalemia: Secondary | ICD-10-CM

## 2018-06-09 LAB — BASIC METABOLIC PANEL
BUN: 28 mg/dL — AB (ref 6–23)
CALCIUM: 9.7 mg/dL (ref 8.4–10.5)
CO2: 31 mEq/L (ref 19–32)
CREATININE: 1.06 mg/dL (ref 0.40–1.50)
Chloride: 104 mEq/L (ref 96–112)
GFR: 74.33 mL/min (ref >60.00)
Glucose, Bld: 101 mg/dL — ABNORMAL HIGH (ref 70–99)
Potassium: 4.2 mEq/L (ref 3.5–5.1)
Sodium: 141 mEq/L (ref 135–145)

## 2018-06-09 MED ORDER — AMOXICILLIN 500 MG PO CAPS: 1000.0000 mg | ORAL_CAPSULE | Freq: Two times a day (BID) | ORAL | 0 refills | Status: DC

## 2018-06-09 MED ORDER — AZELASTINE HCL 0.1 % NA SOLN: 2.0000 | Freq: Every evening | NASAL | 3 refills | Status: DC | PRN

## 2018-06-09 NOTE — Progress Notes (Signed)
Subjective:    Patient ID: Justin Murphy, male    DOB: [birth year], 67 y.o.   MRN: [id number]  DOS:  06/09/2018 Type of visit - description : acute Interval history: A week ago, he felt unwell, had a temperature of 100.5, started to feel a "awful smell" through his nose. Fever lasted 1 day, he still felt malaise. Since then is having a lot of right-sided nasal discharge, clear. In the last few days the discharge is turning yellow and he continued to feel the foul smell   Review of Systems Mild frontal headaches Denies any sore throat, cough or chest congestion. No otalgia.  Past Medical History:  Diagnosis Date  . Allergy   . Hypercholesteremia   . Personal history of colonic polyps - adenomas 03/14/2014  . TMJ syndrome   . Urolithiasis    in the 80s    Past Surgical History:  Procedure Laterality Date  . APPENDECTOMY    . COLONOSCOPY  2007   diminutive left hyperplastic polyp  . VASECTOMY      Social History   Socioeconomic History  . Marital status: Married    Spouse name: Not on file  . Number of children: 4  . Years of education: Not on file  . Highest education level: Not on file  Occupational History  . Occupation: Surveyor, minerals: [employer]  . Financial resource strain: Not on file  . Food insecurity:    Worry: Not on file    Inability: Not on file  . Transportation needs:    Medical: Not on file    Non-medical: Not on file  Tobacco Use  . Smoking status: Never Smoker  . Smokeless tobacco: Never Used  Substance and Sexual Activity  . Alcohol use: Yes    Comment: very rare   . Drug use: No  . Sexual activity: Not on file  Lifestyle  . Physical activity:    Days per week: Not on file    Minutes per session: Not on file  . Stress: Not on file  Relationships  . Social connections:    Talks on phone: Not on file    Gets together: Not on file    Attends religious service: Not on file    Active member of club or  organization: Not on file    Attends meetings of clubs or organizations: Not on file    Relationship status: Not on file  . Intimate partner violence:    Fear of current or ex partner: Not on file    Emotionally abused: Not on file    Physically abused: Not on file    Forced sexual activity: Not on file  Other Topics Concern  . Not on file  Social History Narrative   Original from [place] moved to [place] after retirement ~ 2011   Married 39 years Stanton Kidney)   1 daughter, 3 sons  ( no children in [place])           Allergies as of 06/09/2018   No Known Allergies     Medication List        Accurate as of 06/09/18  9:28 AM. Always use your most recent med list.          cetirizine 10 MG tablet Commonly known as:  ZYRTEC Take 10 mg by mouth daily.   fexofenadine 180 MG tablet Commonly known as:  ALLEGRA Take 180 mg by mouth daily.   fluticasone 50  MCG/ACT nasal spray Commonly known as:  FLONASE Place 2 sprays into both nostrils daily as needed for allergies or rhinitis.   multivitamin with minerals Tabs tablet Take 1 tablet by mouth daily.   OMEGA 3 PO Take 1 tablet by mouth 2 (two) times a week.   PROBIOTIC DAILY PO Take 1 tablet by mouth 2 (two) times a week.          Objective:   Physical Exam BP 132/90 (BP Location: Left Arm, Patient Position: Sitting, Cuff Size: Small)   Pulse 76   Temp 98 F (36.7 C) (Oral)   Resp 16   Ht 5\' 7"  (1.702 m)   Wt 180 lb 2 oz (81.7 kg)   SpO2 98%   BMI 28.21 kg/m  General:   Well developed, NAD, see BMI.  HEENT:  Normocephalic . Face symmetric, atraumatic.  Small amount of wax, TMs normal.  Throat symmetric, no discharge. Nose certainly congested.  Sinuses no TTP Lungs:  CTA B Normal respiratory effort, no intercostal retractions, no accessory muscle use. Heart: RRR,  no murmur.  No pretibial edema bilaterally  Skin: Not pale. Not jaundice Neurologic:  alert & oriented X3.  Speech normal, gait appropriate for age and  unassisted Psych--  Cognition and judgment appear intact.  Cooperative with normal attention span and concentration.  Behavior appropriate. No anxious or depressed appearing.      Assessment & Plan:   Assessment : High cholesterol Perennial allergies  H/o GI Bleed and  Colon polyps H/o  urolithiasis H/o shingles  HOH, hearing aids  PLAN: Mild/early sinusitis: Continue Flonase as he is doing, and Astelin temporarily.  Amoxicillin.  Call if no better.  See instructions Hyperkalemia: See recent labs, recheck a BMP today.

## 2018-06-09 NOTE — Patient Instructions (Signed)
GO TO THE LAB : Get the blood work    If  cough:  Take Mucinex DM twice a day as needed until better   Continue  Flonase : 2 nasal sprays on each side of the nose in the morning until you feel better Use ASTELIN a prescribed spray : 2 nasal sprays on each side of the nose at night until you feel better    Take the antibiotic as prescribed  (Amoxicillin)  Call if not gradually better over the next  10 days  Call anytime if the symptoms are severe

## 2018-06-09 NOTE — Progress Notes (Signed)
Pre visit review using our clinic review tool, if applicable. No additional management support is needed unless otherwise documented below in the visit note. 

## 2018-06-10 NOTE — Assessment & Plan Note (Signed)
Mild/early sinusitis: Continue Flonase as he is doing, and Astelin temporarily.  Amoxicillin.  Call if no better.  See instructions Hyperkalemia: See recent labs, recheck a BMP today.

## 2018-07-14 DIAGNOSIS — Z23 Encounter for immunization: Secondary | ICD-10-CM | POA: Diagnosis not present

## 2019-04-18 ENCOUNTER — Encounter: Payer: Self-pay | Admitting: Internal Medicine

## 2019-06-14 ENCOUNTER — Other Ambulatory Visit: Payer: Self-pay

## 2019-06-15 ENCOUNTER — Ambulatory Visit (INDEPENDENT_AMBULATORY_CARE_PROVIDER_SITE_OTHER): Payer: Medicare Other | Admitting: Internal Medicine

## 2019-06-15 ENCOUNTER — Encounter: Payer: Self-pay | Admitting: Internal Medicine

## 2019-06-15 ENCOUNTER — Other Ambulatory Visit: Payer: Self-pay

## 2019-06-15 VITALS — BP 150/95 | HR 66 | Temp 97.5°F | Resp 16 | Ht 67.0 in | Wt 164.1 lb

## 2019-06-15 DIAGNOSIS — F988 Other specified behavioral and emotional disorders with onset usually occurring in childhood and adolescence: Secondary | ICD-10-CM | POA: Diagnosis not present

## 2019-06-15 DIAGNOSIS — E785 Hyperlipidemia, unspecified: Secondary | ICD-10-CM

## 2019-06-15 DIAGNOSIS — G5601 Carpal tunnel syndrome, right upper limb: Secondary | ICD-10-CM

## 2019-06-15 DIAGNOSIS — R030 Elevated blood-pressure reading, without diagnosis of hypertension: Secondary | ICD-10-CM

## 2019-06-15 DIAGNOSIS — Z125 Encounter for screening for malignant neoplasm of prostate: Secondary | ICD-10-CM

## 2019-06-15 DIAGNOSIS — Z23 Encounter for immunization: Secondary | ICD-10-CM

## 2019-06-15 LAB — LIPID PANEL
Cholesterol: 179 mg/dL (ref 0–200)
HDL: 42 mg/dL (ref >39.00)
LDL Cholesterol: 123 mg/dL — ABNORMAL HIGH (ref 0–99)
NonHDL: 137.09
Total CHOL/HDL Ratio: 4
Triglycerides: 69 mg/dL (ref 0.0–149.0)
VLDL: 13.8 mg/dL (ref 0.0–40.0)

## 2019-06-15 LAB — COMPREHENSIVE METABOLIC PANEL
ALT: 18 U/L (ref 0–53)
AST: 16 U/L (ref 0–37)
Albumin: 4.7 g/dL (ref 3.5–5.2)
Alkaline Phosphatase: 65 U/L (ref 39–117)
BUN: 17 mg/dL (ref 6–23)
CO2: 30 mEq/L (ref 19–32)
Calcium: 10 mg/dL (ref 8.4–10.5)
Chloride: 104 mEq/L (ref 96–112)
Creatinine, Ser: 1.01 mg/dL (ref 0.40–1.50)
GFR: 73.72 mL/min (ref >60.00)
Glucose, Bld: 87 mg/dL (ref 70–99)
Potassium: 4.8 mEq/L (ref 3.5–5.1)
Sodium: 143 mEq/L (ref 135–145)
Total Bilirubin: 0.5 mg/dL (ref 0.2–1.2)
Total Protein: 6.9 g/dL (ref 6.0–8.3)

## 2019-06-15 LAB — PSA: PSA: 0.97 ng/mL (ref 0.10–4.00)

## 2019-06-15 NOTE — Progress Notes (Signed)
Subjective:    Patient ID: Justin Murphy, male    DOB: [birth year], 68 y.o.   MRN: [id number]  DOS:  06/15/2019 Type of visit - description: Routine checkup He has several concerns For 6 months has noted occasional hoarseness, once or twice a week, typically at night. He does not smoke, denies GERD or heartburn.  CTS?  The patient has been working remotely and typing many hours a day.  Has developed numbness and tingling at the tip of the right hand fingers.  He got a brace for CTS and is somewhat better.  ADD?  For the last 5 years he has noticed that he is not able to focus as much as before, often times my mind "drifts off". 3 of his children has history of ADD and take medications. He works in Designer, jewellery. No anxiety or depression per se.  Elevated BP noted, no history of recent ambulatory BPs  Review of Systems Specifically denies chest pain no difficulty breathing No nausea, vomiting, diarrhea No dysuria, gross hematuria. No dizziness.  Very rarely has a mild headache  Past Medical History:  Diagnosis Date  . Allergy   . Hypercholesteremia   . Personal history of colonic polyps - adenomas 03/14/2014  . TMJ syndrome   . Urolithiasis    in the 80s    Past Surgical History:  Procedure Laterality Date  . APPENDECTOMY    . COLONOSCOPY  2007   diminutive left hyperplastic polyp  . VASECTOMY      Social History   Socioeconomic History  . Marital status: Married    Spouse name: Not on file  . Number of children: 4  . Years of education: Not on file  . Highest education level: Not on file  Occupational History  . Occupation: Surveyor, minerals: [employer]  . Financial resource strain: Not on file  . Food insecurity    Worry: Not on file    Inability: Not on file  . Transportation needs    Medical: Not on file    Non-medical: Not on file  Tobacco Use  . Smoking status: Never Smoker  . Smokeless tobacco: Never Used  Substance and  Sexual Activity  . Alcohol use: Yes    Comment: very rare   . Drug use: No  . Sexual activity: Not on file  Lifestyle  . Physical activity    Days per week: Not on file    Minutes per session: Not on file  . Stress: Not on file  Relationships  . Social Herbalist on phone: Not on file    Gets together: Not on file    Attends religious service: Not on file    Active member of club or organization: Not on file    Attends meetings of clubs or organizations: Not on file    Relationship status: Not on file  . Intimate partner violence    Fear of current or ex partner: Not on file    Emotionally abused: Not on file    Physically abused: Not on file    Forced sexual activity: Not on file  Other Topics Concern  . Not on file  Social History Narrative   Original from [place] moved to [place] after retirement ~ 2011   Married 31 years [facility]ney)   1 daughter, 3 sons  ( no children in [place])           Allergies as of 06/15/2019  No Known Allergies     Medication List       Accurate as of June 15, 2019 11:08 AM. If you have any questions, ask your nurse or doctor.        amoxicillin 500 MG capsule Commonly known as: AMOXIL Take 2 capsules (1,000 mg total) by mouth 2 (two) times daily.   azelastine 0.1 % nasal spray Commonly known as: ASTELIN Place 2 sprays into both nostrils at bedtime as needed for rhinitis.   cetirizine 10 MG tablet Commonly known as: ZYRTEC Take 10 mg by mouth daily.   fluticasone 50 MCG/ACT nasal spray Commonly known as: FLONASE Place 2 sprays into both nostrils daily as needed for allergies or rhinitis.   multivitamin with minerals Tabs tablet Take 1 tablet by mouth daily.   OMEGA 3 PO Take 1 tablet by mouth 2 (two) times a week.           Objective:   Physical Exam BP (!) 146/92 (BP Location: Left Arm, Patient Position: Sitting, Cuff Size: Small)   Pulse 66   Temp (!) 97.5 F (36.4 C) (Temporal)   Resp 16   Ht 5\' 7"   (1.702 m)   Wt 164 lb 2 oz (74.4 kg)   SpO2 100%   BMI 25.71 kg/m  General: Well developed, NAD, BMI noted Neck: No  thyromegaly, no unusual lymph nodes or mass. HEENT:  Normocephalic . Face symmetric, atraumatic Lungs:  CTA B Normal respiratory effort, no intercostal retractions, no accessory muscle use. Heart: RRR,  no murmur.  No pretibial edema bilaterally  Abdomen:  Not distended, soft, non-tender. No rebound or rigidity. DRE: Normal sphincter tone, prostate benign Skin: Exposed areas without rash. Not pale. Not jaundice Neurologic:  alert & oriented X3.  Speech normal, gait appropriate for age and unassisted. DTR symmetric. Strength symmetric and appropriate for age.  Hands with normal muscle tone and bulk. Psych: Cognition and judgment appear intact.  Cooperative with normal attention span and concentration.  Behavior appropriate. No anxious or depressed appearing.     Assessment     Assessment : High cholesterol Perennial allergies  H/o GI Bleed and  Colon polyps H/o  urolithiasis H/o shingles  HOH, hearing aids  PLAN: High cholesterol: Diet controlled, check a FLP Elevated BP: New issue, BP slightly elevated, recheck by me: 150/95, Rx   low-salt diet, continue with his healthy lifestyle otherwise.  Check BPs at home and come back in 3 months Check a CMP EKG today: NSR Hoarseness: As described above, non-smoker, exam is benign, sxs are sporadic, rx observation.  ENT if symptoms persist or increase. CTS: Agreed that problem could be due to CTS but also cubital tunnel syndrome?. Recommend to avoid putting pressure on the elbow and continue using CTS braces. ADD?  See HPI, he is 34, he had a successful career, some of his symptoms are consistent with ADD but at this point if he likes to take medication I would recommend formal evaluation by psychology or neurology.  ADD medications are no free of side effects including BP elevation etc. Preventive care reviewed  RTC 3 months

## 2019-06-15 NOTE — Progress Notes (Signed)
Pre visit review using our clinic review tool, if applicable. No additional management support is needed unless otherwise documented below in the visit note. 

## 2019-06-15 NOTE — Assessment & Plan Note (Addendum)
-  Tdap 2018 - PNM shots, declined , explained benefits - shingrex d/w pt, will think about it  - flu shot today -CCS:  (+) FH Colon Ca, h/o GI bleed   Had a cscope at age 68, no reports Cscope 02-2014, 3 polyps, 3 years, pt knows is overdue , lives in the [facility] , plans to talk w/ a GI over there  - prostate ca screening: DRE normal, check a PSA

## 2019-06-15 NOTE — Patient Instructions (Addendum)
Please schedule Medicare Wellness with Glenard Haring.   GO TO THE LAB : Get the blood work     GO TO THE FRONT DESK Schedule your next appointment   for a checkup here in 3 months   Check the  blood pressure weekly BP GOAL is between 110/65 and  135/85. If it is consistently higher or lower, let me know    Carpal tunnel syndrome? Continue to use the brace and the right wrist, definitely at night, during the day if possible. Avoid putting pressure on your elbow when you sit down and type Call if not improving

## 2019-06-15 NOTE — Assessment & Plan Note (Signed)
High cholesterol: Diet controlled, check a FLP Elevated BP: New issue, BP slightly elevated, recheck by me: 150/95, Rx   low-salt diet, continue with his healthy lifestyle otherwise.  Check BPs at home and come back in 3 months Check a CMP EKG today: NSR Hoarseness: As described above, non-smoker, exam is benign, sxs are sporadic, rx observation.  ENT if symptoms persist or increase. CTS: Agreed that problem could be due to CTS but also cubital tunnel syndrome?. Recommend to avoid putting pressure on the elbow and continue using CTS braces. ADD?  See HPI, he is 9, he had a successful career, some of his symptoms are consistent with ADD but at this point if he likes to take medication I would recommend formal evaluation by psychology or neurology.  ADD medications are no free of side effects including BP elevation etc. Preventive care reviewed RTC 3 months

## 2019-08-23 DIAGNOSIS — H43393 Other vitreous opacities, bilateral: Secondary | ICD-10-CM | POA: Diagnosis not present

## 2019-10-06 DIAGNOSIS — Z23 Encounter for immunization: Secondary | ICD-10-CM | POA: Diagnosis not present

## 2019-11-03 DIAGNOSIS — Z23 Encounter for immunization: Secondary | ICD-10-CM | POA: Diagnosis not present

## 2020-08-06 DIAGNOSIS — Z23 Encounter for immunization: Secondary | ICD-10-CM | POA: Diagnosis not present
